# Patient Record
Sex: Female | Born: 1995 | Race: White | Hispanic: No | Marital: Single | Smoking: Never smoker
Health system: Southern US, Community
[De-identification: ages and names within clinical notes are randomized; demographics above are authoritative.]

## PROBLEM LIST (undated history)

## (undated) DIAGNOSIS — S069X9A Unspecified intracranial injury with loss of consciousness of unspecified duration, initial encounter: Secondary | ICD-10-CM

## (undated) DIAGNOSIS — F909 Attention-deficit hyperactivity disorder, unspecified type: Secondary | ICD-10-CM

## (undated) DIAGNOSIS — S069XAA Unspecified intracranial injury with loss of consciousness status unknown, initial encounter: Secondary | ICD-10-CM

## (undated) DIAGNOSIS — F319 Bipolar disorder, unspecified: Secondary | ICD-10-CM

## (undated) DIAGNOSIS — F419 Anxiety disorder, unspecified: Secondary | ICD-10-CM

## (undated) DIAGNOSIS — G35 Multiple sclerosis: Secondary | ICD-10-CM

## (undated) HISTORY — DX: Anxiety disorder, unspecified: F41.9

## (undated) HISTORY — PX: MYRINGOTOMY: SUR874

---
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
## 2016-09-13 ENCOUNTER — Encounter: Payer: Self-pay | Admitting: Internal Medicine

## 2017-07-17 ENCOUNTER — Encounter: Payer: Self-pay | Admitting: Obstetrics and Gynecology

## 2017-07-17 ENCOUNTER — Other Ambulatory Visit (HOSPITAL_COMMUNITY)
Admission: RE | Admit: 2017-07-17 | Discharge: 2017-07-17 | Disposition: A | Discharge status: Home / Self Care | Payer: Medicaid Other | Source: Ambulatory Visit | Attending: Obstetrics and Gynecology | Admitting: Obstetrics and Gynecology

## 2017-07-17 ENCOUNTER — Ambulatory Visit (INDEPENDENT_AMBULATORY_CARE_PROVIDER_SITE_OTHER): Payer: Medicaid Other | Admitting: Obstetrics and Gynecology

## 2017-07-17 VITALS — BP 108/70 | HR 67 | Ht 63.0 in | Wt 106.7 lb

## 2017-07-17 DIAGNOSIS — Z87898 Personal history of other specified conditions: Secondary | ICD-10-CM | POA: Insufficient documentation

## 2017-07-17 DIAGNOSIS — Z3049 Encounter for surveillance of other contraceptives: Secondary | ICD-10-CM | POA: Diagnosis not present

## 2017-07-17 DIAGNOSIS — Z8742 Personal history of other diseases of the female genital tract: Secondary | ICD-10-CM

## 2017-07-17 DIAGNOSIS — Z308 Encounter for other contraceptive management: Secondary | ICD-10-CM

## 2017-07-17 DIAGNOSIS — Z01419 Encounter for gynecological examination (general) (routine) without abnormal findings: Secondary | ICD-10-CM

## 2017-07-17 MED ORDER — NORETHIN ACE-ETH ESTRAD-FE 1-20 MG-MCG PO TABS: 1.0000 | ORAL_TABLET | Freq: Every day | ORAL | 11 refills | Status: DC

## 2017-07-17 NOTE — Patient Instructions (Signed)

## 2017-07-17 NOTE — Progress Notes (Signed)
Denise [name] is a 21 y.o. presents for yearly GYN exam and restart of OCP's. Last pap was in April 2017, LGSIL. Had previously took [place] until she ran. Sexual active without problems. She has no GYN complaints today.   Denies abnormal vaginal bleeding, discharge, pelvic pain, problems with intercourse or other gynecologic concerns.    Gynecologic History Patient's last menstrual period was 06/17/2017. Contraception: OCP (estrogen/progesterone) Last Pap: 2017. Results were: abnormal Last mammogram: NA. Results were: NA  Obstetric History OB History  No data available    Past Medical History:  Diagnosis Date  . ADHD (attention deficit hyperactivity disorder)     Past Surgical History:  Procedure Laterality Date  . MYRINGOTOMY    . WISDOM TOOTH EXTRACTION  2014    No current outpatient prescriptions on file prior to visit.   No current facility-administered medications on file prior to visit.     No Known Allergies  Social History   Social History  . Marital status: Single    Spouse name: N/A  . Number of children: N/A  . Years of education: N/A   Occupational History  . Not on file.   Social History Main Topics  . Smoking status: Never Smoker  . Smokeless tobacco: Never Used  . Alcohol use Yes     Comment: social  . Drug use: Yes    Types: Marijuana     Comment: once a week  . Sexual activity: Yes    Partners: Male    Birth control/ protection: None   Other Topics Concern  . Not on file   Social History Narrative  . No narrative on file    Family History  Problem Relation Age of Onset  . Hypertension Mother   . Hypertension Maternal Grandmother     The following portions of the patient's history were reviewed and updated as appropriate: allergies, current medications, past family history, past medical history, past social history, past surgical history and problem list.  Review of Systems Pertinent items are noted in HPI.   Objective:  BP  108/70   Pulse 67   Ht 5\' 3"  (1.6 m)   Wt 106 lb 11.2 oz (48.4 kg)   LMP 06/17/2017   BMI 18.90 kg/m  CONSTITUTIONAL: Well-developed, well-nourished female in no acute distress.  HENT:  Normocephalic, atraumatic, External right and left ear normal. Oropharynx is clear and moist EYES: Conjunctivae and EOM are normal. Pupils are equal, round, and reactive to light. No scleral icterus.  NECK: Normal range of motion, supple, no masses.  Normal thyroid.  SKIN: Skin is warm and dry. No rash noted. Not diaphoretic. No erythema. No pallor. NEUROLGIC: Alert and oriented to person, place, and time. Normal reflexes, muscle tone coordination. No cranial nerve deficit noted. PSYCHIATRIC: Normal mood and affect. Normal behavior. Normal judgment and thought content. CARDIOVASCULAR: Normal heart rate noted, regular rhythm RESPIRATORY: Clear to auscultation bilaterally. Effort and breath sounds normal, no problems with respiration noted. BREASTS: Symmetric in size. No masses, skin changes, nipple drainage, or lymphadenopathy. ABDOMEN: Soft, normal bowel sounds, no distention noted.  No tenderness, rebound or guarding.  PELVIC: Normal appearing external genitalia; normal appearing vaginal mucosa and cervix.  No abnormal discharge noted.  Pap smear obtained.  Normal uterine size, no other palpable masses, no uterine or adnexal tenderness. MUSCULOSKELETAL: Normal range of motion. No tenderness.  No cyanosis, clubbing, or edema.  2+ distal pulses.   Assessment:  Annual gynecologic examination with pap smear H/O abnormal pap smear  Contraceptive management Plan:  Will follow up results of pap smear and manage accordingly. Renew OCP's. U/R/B reviewed. To start after next cycle. Advised to use back up method.  Routine preventative health maintenance measures emphasized. Please refer to After Visit Summary for other counseling recommendations.    Hermina Staggers, MD, FACOG Attending Obstetrician &  Gynecologist Center for [facility], [facility]

## 2017-07-19 LAB — CYTOLOGY - PAP
Chlamydia: POSITIVE — AB
Diagnosis: NEGATIVE
HPV: NOT DETECTED
Neisseria Gonorrhea: NEGATIVE

## 2017-07-23 ENCOUNTER — Other Ambulatory Visit: Payer: Self-pay

## 2017-07-23 DIAGNOSIS — A749 Chlamydial infection, unspecified: Secondary | ICD-10-CM

## 2017-07-23 MED ORDER — AZITHROMYCIN 500 MG PO TABS: 1000.0000 mg | ORAL_TABLET | Freq: Once | ORAL | 1 refills | Status: AC

## 2017-08-27 ENCOUNTER — Emergency Department (HOSPITAL_COMMUNITY)
Admission: EM | Admit: 2017-08-27 | Discharge: 2017-08-28 | Disposition: A | Discharge status: Home / Self Care | Payer: Medicaid Other | Attending: Emergency Medicine | Admitting: Emergency Medicine

## 2017-08-27 ENCOUNTER — Encounter: Payer: Self-pay | Admitting: Obstetrics

## 2017-08-27 ENCOUNTER — Other Ambulatory Visit (HOSPITAL_COMMUNITY)
Admission: RE | Admit: 2017-08-27 | Discharge: 2017-08-27 | Disposition: A | Discharge status: Home / Self Care | Payer: Medicaid Other | Source: Ambulatory Visit | Attending: Obstetrics | Admitting: Obstetrics

## 2017-08-27 ENCOUNTER — Encounter (HOSPITAL_COMMUNITY): Payer: Self-pay | Admitting: Emergency Medicine

## 2017-08-27 ENCOUNTER — Other Ambulatory Visit (INDEPENDENT_AMBULATORY_CARE_PROVIDER_SITE_OTHER): Payer: Medicaid Other | Admitting: Obstetrics

## 2017-08-27 VITALS — BP 130/84 | HR 84 | Wt 97.2 lb

## 2017-08-27 DIAGNOSIS — Z202 Contact with and (suspected) exposure to infections with a predominantly sexual mode of transmission: Secondary | ICD-10-CM | POA: Diagnosis not present

## 2017-08-27 DIAGNOSIS — F32A Depression, unspecified: Secondary | ICD-10-CM

## 2017-08-27 DIAGNOSIS — F329 Major depressive disorder, single episode, unspecified: Secondary | ICD-10-CM | POA: Insufficient documentation

## 2017-08-27 DIAGNOSIS — Z008 Encounter for other general examination: Secondary | ICD-10-CM | POA: Insufficient documentation

## 2017-08-27 DIAGNOSIS — Z79899 Other long term (current) drug therapy: Secondary | ICD-10-CM | POA: Insufficient documentation

## 2017-08-27 NOTE — Progress Notes (Signed)
Patient complains of being stressed out, she has lost 9 lbs since July 25th. She is here for TOC + chlamydia.

## 2017-08-27 NOTE — ED Notes (Signed)
Bed: WLPT4 Expected date:  Expected time:  Means of arrival:  Comments: 

## 2017-08-28 ENCOUNTER — Encounter: Payer: Self-pay | Admitting: Obstetrics

## 2017-08-28 LAB — COMPREHENSIVE METABOLIC PANEL
ALT: 12 U/L — ABNORMAL LOW (ref 14–54)
AST: 18 U/L (ref 15–41)
Albumin: 4.4 g/dL (ref 3.5–5.0)
Alkaline Phosphatase: 34 U/L — ABNORMAL LOW (ref 38–126)
Anion gap: 8 (ref 5–15)
BUN: 16 mg/dL (ref 6–20)
CO2: 24 mmol/L (ref 22–32)
Calcium: 9.2 mg/dL (ref 8.9–10.3)
Chloride: 105 mmol/L (ref 101–111)
Creatinine, Ser: 0.61 mg/dL (ref 0.44–1.00)
GFR calc Af Amer: >60 mL/min (ref >60)
GFR calc non Af Amer: >60 mL/min (ref >60)
Glucose, Bld: 90 mg/dL (ref 65–99)
Potassium: 3.4 mmol/L — ABNORMAL LOW (ref 3.5–5.1)
Sodium: 137 mmol/L (ref 135–145)
Total Bilirubin: 0.4 mg/dL (ref 0.3–1.2)
Total Protein: 7.6 g/dL (ref 6.5–8.1)

## 2017-08-28 LAB — PREGNANCY, URINE: Preg Test, Ur: NEGATIVE

## 2017-08-28 LAB — CBC
HCT: 36.9 % (ref 36.0–46.0)
Hemoglobin: 13 g/dL (ref 12.0–15.0)
MCH: 31.3 pg (ref 26.0–34.0)
MCHC: 35.2 g/dL (ref 30.0–36.0)
MCV: 88.9 fL (ref 78.0–100.0)
Platelets: 256 10*3/uL (ref 150–400)
RBC: 4.15 MIL/uL (ref 3.87–5.11)
RDW: 12.4 % (ref 11.5–15.5)
WBC: 10.8 10*3/uL — ABNORMAL HIGH (ref 4.0–10.5)

## 2017-08-28 LAB — RAPID URINE DRUG SCREEN, HOSP PERFORMED
Amphetamines: NOT DETECTED
Barbiturates: NOT DETECTED
Benzodiazepines: NOT DETECTED
Cocaine: NOT DETECTED
Opiates: NOT DETECTED
Tetrahydrocannabinol: POSITIVE — AB

## 2017-08-28 LAB — ETHANOL: Alcohol, Ethyl (B): <5 mg/dL (ref <5)

## 2017-08-28 LAB — ACETAMINOPHEN LEVEL: Acetaminophen (Tylenol), Serum: <10 ug/mL — ABNORMAL LOW (ref 10–30)

## 2017-08-28 LAB — SALICYLATE LEVEL: Salicylate Lvl: <7 mg/dL (ref 2.8–30.0)

## 2017-08-28 NOTE — ED Notes (Signed)
Pt ambulatory and independent at discharge.  Verbalized understanding of discharge instructions 

## 2017-08-28 NOTE — Discharge Instructions (Signed)
Substance Abuse Treatment Programs ° °Intensive Outpatient Programs °[facility]     °[address]      °[place], [place]                   °[phone]      ° °[facility] °[address] °[place], [place] °[phone] ° °[facility]     °(Inpatient and outpatient)     °[address]           °[phone]   ° °[facility] °[phone] (Suboxone and Methadone) ° °[address]      °[place], [place] [address]      °[phone]      ° °[address] °[place], [place] °[phone] ° °[facility] (Outpatient/Inpatient, [facility])    °(insurance only) [phone]      °       °[facility] (Groups & Residential) °[place], [place] °[phone] ° °   °[facility]     °[address]     °[place], [place]      °[phone]      ° °[facility] (for caregivers and family) °[address] °[place], [place] °336-299-4655 ° ° ° ° ° °Residential Treatment Programs °[facility]      °[address], [place], [place] [address]  °[phone]      ° °[facility] °[address] [place], [place] [address] °[phone] ° °[facility]        °[phone]      ° °[facility] °[phone] ° °[facility]             °[address]                                         °[place], [place]                                                °[phone] or [phone]                              ° °[facility] [place] °[address] °[place], [address] °[phone] ° °[facility]    °[address]      °[place], [place]     °[phone]      ° °[facility] °[address] °[place], [place] °[phone] ° °[facility]   °[address]     °[place], [place] [address]     °[phone]      °Admissions: 8am-3pm M-F ° °[facility] °[address] °[place],  [place] °[phone] ° °[facility]: Residential Program (90 Days)   °[place],       °336-725-8389 or [phone]    ° °[facility] °[place],  °(Walk in Hours over the weekend or by referral) ° °[facility] °[address], [place],  [address] °([phone] ° °Crisis Mobile: [facility]:  1-877-626-1772 (for crisis response 24 hours a day) °[facility] Hotline:      1-800-256-2452 °Outpatient Psychiatry and Counseling ° °[facility]: Mobile Crisis   Management 24 hours:  1-877-626-1772 ° °[facility] sliding scale fee and walk in schedule: M-F 8am-12pm/1pm-3pm °[address]  °[place], [place] [address] °[phone] ° °[facility] °[address] °[place], [place] [address] °[phone] ° °[facility] (Formerly known [facility])- new patient walk-in appointments available Monday - Friday 8am -3pm.          °[address] °[place], [place] [address] °336-676-6840 or crisis line- 336-676-6905 ° °[facility]/ Intensive Outpatient Therapy Program °[address] °[place], [place] [address] °336-832-9804 ° °[facility]                  °Crisis Services      °[phone]      °[address]     °[place], [place] [address]                ° °[facility]   °[facility] °[phone] °[address] °[place], [place] [address] ° ° °[facility]          °[address],  °[place], [name] [address]       °[phone] ° °[facility] °[address] °[place], [place] [address] °336-292-1510 ° °[facility]    °[address]    °[place], [place] [address]     °[phone]      ° °Parish McKinney, MD     °[address]     °[place] [address]     °336-282-1251     °  °[facility] °[address] °[place] [address] ° °[facility]     °[address]     °[place], [place]      °336-542-2076      ° °[facility] °Shamsher Ahluwalia, MD °[address] °[place], [place] [address] °[phone] ° °[facility]     °[address]     °[place], [place] [address]     °336-274-1237      ° °[facility] °[address] °[place], [place] [address] °336-854-4450 °Resources for Temporary Residential Assistance/Crisis Centers ° °DAY CENTERS °[facility] °M-F 8am-3pm   °[address] [place], [place] [address]   336-332-0824 °Services include: laundry, barbering, support groups, case management, phone  & computer access, showers, AA/NA mtgs, mental health/substance abuse nurse, job skills class, disability information, VA assistance, spiritual classes, etc.  ° °HOMELESS SHELTERS ° °[facility]     °[facility]   °[address], [place]     °[phone]       °       °[facility] (women and children)       °[address] °[place], [place] [address] °336-275-0820 °[email or web address] for application and process °Application Required ° °[facility]   °[address]    °[place] [address]     °[phone]       °             °[facility] °[address] °, [place] [address] °336.273.5572 °336-235-0363(schedule application appt.) °Application Required ° °[facility] (women only)    °[address]     °[place], [place] [address]     °336-884-1039      °  Intake starts 6pm daily °Need valid ID, SSC, & Police report °[facility] [place] °[address] °[place], [place] °336-881-5420 °Application Required ° °[facility] (men only)     °[address]      °[place], [place]     °[phone]      ° °[facility] °(Pregnant women only) °[address] °[place], [place] °336-275-0206 ° °[facility]      °[address]      °[place], [place] [address]     °[phone]      °       °[facility] °[address] °[place], [place] °336-723-1848 °90 day commitment/SA/Application process ° °[facility](men only)     °[address]     °[place], [place]     °[phone]       °Check-in at 7pm     °       °[facility] °[address] °[place], [place] [address] °336-248-6684 °Men/Women/Women and Children must be there by 7 pm ° °[facility] °[place], [place] °336-722-8721                ° °

## 2017-08-28 NOTE — ED Provider Notes (Signed)
[facility]-EMERGENCY DEPT Provider Note   CSN: [id number] Arrival date & time: 08/27/17  2144     History   Chief Complaint Chief Complaint  Patient presents with  . Medical Clearance  . Depression    HPI Denise [name] is a 21 y.o. female.  The history is provided by the patient and a parent.  Depression  This is a chronic problem. The current episode started more than 1 week ago. The problem occurs daily. The problem has been gradually worsening. Pertinent negatives include no chest pain, no abdominal pain, no headaches and no shortness of breath. Nothing relieves the symptoms. She has tried nothing for the symptoms.  pt with long h/o depression due to family issues as well as job stress She has not seen any therapist recently for this  She is not taking meds No SI/HI reported She reports tonight her symptoms became severe and that is why she came to ED   Past Medical History:  Diagnosis Date  . ADHD (attention deficit hyperactivity disorder)     Patient Active Problem List   Diagnosis Date Noted  . Encounter for other contraceptive management 07/17/2017    Past Surgical History:  Procedure Laterality Date  . MYRINGOTOMY    . WISDOM TOOTH EXTRACTION  2014    OB History    No data available       Home Medications    Prior to Admission medications   Medication Sig Start Date End Date Taking? Authorizing Provider  norethindrone-ethinyl estradiol (JUNEL FE 1/20) 1-20 MG-MCG tablet Take 1 tablet by mouth daily. 07/17/17  Yes Hermina Staggers, MD    Family History Family History  Problem Relation Age of Onset  . Hypertension Mother   . Hypertension Maternal Grandmother     Social History Social History  Substance Use Topics  . Smoking status: Never Smoker  . Smokeless tobacco: Never Used  . Alcohol use Yes     Comment: social     Allergies   Patient has no known allergies.   Review of Systems Review of Systems  Constitutional: Negative for fever.    Respiratory: Negative for shortness of breath.   Cardiovascular: Negative for chest pain.  Gastrointestinal: Negative for abdominal pain.  Neurological: Negative for headaches.  Psychiatric/Behavioral: Positive for depression. Negative for suicidal ideas. The patient is nervous/anxious.   All other systems reviewed and are negative.    Physical Exam Updated Vital Signs BP 120/82 (BP Location: Left Arm)   Pulse 71   Temp 98.2 F (36.8 C) (Oral)   Resp 16   Ht 1.575 m (5\' 2" )   Wt 43.5 kg (96 lb)   LMP 08/18/2017 (Approximate)   SpO2 100%   BMI 17.56 kg/m   Physical Exam CONSTITUTIONAL: thin appearing, tearful HEAD: Normocephalic/atraumatic EYES: EOMI ENMT: Mucous membranes moist NECK: supple no meningeal signs CV: S1/S2 noted LUNGS: Lungs are clear to auscultation bilaterally, no apparent distress ABDOMEN: soft GU:no cva tenderness NEURO: Pt is awake/alert/appropriate, moves all extremitiesx4.   EXTREMITIES:   full ROM SKIN: warm, color normal PSYCH: tearful  ED Treatments / Results  Labs (all labs ordered are listed, but only abnormal results are displayed) Labs Reviewed  COMPREHENSIVE METABOLIC PANEL - Abnormal; Notable for the following:       Result Value   Potassium 3.4 (*)    ALT 12 (*)    Alkaline Phosphatase 34 (*)    All other components within normal limits  ACETAMINOPHEN LEVEL - Abnormal; Notable for  the following:    Acetaminophen (Tylenol), Serum <10 (*)    All other components within normal limits  CBC - Abnormal; Notable for the following:    WBC 10.8 (*)    All other components within normal limits  RAPID URINE DRUG SCREEN, HOSP PERFORMED - Abnormal; Notable for the following:    Tetrahydrocannabinol POSITIVE (*)    All other components within normal limits  ETHANOL  SALICYLATE LEVEL  PREGNANCY, URINE    EKG  EKG Interpretation None       Radiology No results found.  Procedures Procedures (including critical care  time)  Medications Ordered in ED Medications - No data to display   Initial Impression / Assessment and Plan / ED Course  I have reviewed the triage vital signs and the nursing notes.  Pertinent labs  results that were available during my care of the patient were reviewed by me and considered in my medical decision making (see chart for details).     After long discussion, pt would like to be discharged with resources This has been an ongoing issue for years She is not acutely suicidal She has mother present who will provide support Outpatient resources given We discussed strict ER return precautions   Final Clinical Impressions(s) / ED Diagnoses   Final diagnoses:  Depression, unspecified depression type    New Prescriptions Discharge Medication List as of 08/28/2017 12:54 AM       Zadie Rhine, MD 08/28/17 0221

## 2017-08-28 NOTE — Progress Notes (Signed)
Patient ID: Denise [name], female   DOB: 1996/08/29, 21 y.o.   MRN: [id number]  No chief complaint on file.   HPI Denise [name] is a 21 y.o. female.  History of positive chlamydia, treated.  Presents today for TOC cultures. HPI  Past Medical History:  Diagnosis Date  . ADHD (attention deficit hyperactivity disorder)     Past Surgical History:  Procedure Laterality Date  . MYRINGOTOMY    . WISDOM TOOTH EXTRACTION  2014    Family History  Problem Relation Age of Onset  . Hypertension Mother   . Hypertension Maternal Grandmother     Social History Social History  Substance Use Topics  . Smoking status: Never Smoker  . Smokeless tobacco: Never Used  . Alcohol use Yes     Comment: social    No Known Allergies  Current Outpatient Prescriptions  Medication Sig Dispense Refill  . norethindrone-ethinyl estradiol (JUNEL FE 1/20) 1-20 MG-MCG tablet Take 1 tablet by mouth daily. 1 Package 11   No current facility-administered medications for this visit.     Review of Systems Review of Systems Constitutional: negative for fatigue and weight loss Respiratory: negative for cough and wheezing Cardiovascular: negative for chest pain, fatigue and palpitations Gastrointestinal: negative for abdominal pain and change in bowel habits Genitourinary:negative Integument/breast: negative for nipple discharge Musculoskeletal:negative for myalgias Neurological: negative for gait problems and tremors Behavioral/Psych: negative for abusive relationship, depression Endocrine: negative for temperature intolerance      Blood pressure 130/84, pulse 84, weight 97 lb 3.2 oz (44.1 kg), last menstrual period 08/18/2017.  Physical Exam Physical Exam           General:  Alert and no distress Abdomen:  normal findings: no organomegaly, soft, non-tender and no hernia  Pelvis:  External genitalia: normal general appearance Urinary system: urethral meatus normal and bladder without fullness,  nontender Vaginal: normal without tenderness, induration or masses Cervix: normal appearance Adnexa: normal bimanual exam Uterus: anteverted and non-tender, normal size    50% of 15 min visit spent on counseling and coordination of care.    Data Reviewed Cultures  Assessment     1. Chlamydia contact, treated Rx: - Cervicovaginal ancillary only    Plan    Follow up in 1 year for Annual.  No orders of the defined types were placed in this encounter.  No orders of the defined types were placed in this encounter.

## 2017-08-28 NOTE — [facility] Counselor (Signed)
Clinician spoke to [place], [place] and noted the pt will be discharged with OPT resources.  Redmond Pulling, MS, [facility], [facility] Triage Specialist [phone]

## 2017-08-29 LAB — CERVICOVAGINAL ANCILLARY ONLY
Chlamydia: NEGATIVE
Neisseria Gonorrhea: NEGATIVE

## 2018-07-13 ENCOUNTER — Other Ambulatory Visit: Payer: Self-pay | Admitting: Obstetrics and Gynecology

## 2018-07-13 DIAGNOSIS — Z308 Encounter for other contraceptive management: Secondary | ICD-10-CM

## 2018-12-04 ENCOUNTER — Other Ambulatory Visit: Payer: Self-pay | Admitting: *Deleted

## 2018-12-04 DIAGNOSIS — Z308 Encounter for other contraceptive management: Secondary | ICD-10-CM

## 2018-12-04 MED ORDER — NORETHIN ACE-ETH ESTRAD-FE 1-20 MG-MCG PO TABS: 1.0000 | ORAL_TABLET | Freq: Every day | ORAL | 0 refills | Status: DC

## 2018-12-04 NOTE — Progress Notes (Signed)
Refill request from pharmacy for [facility] pill. One refill sent to pharmacy and pt has been scheduled for AEX. Pt made aware to keep appt in order to continue refills.

## 2018-12-26 ENCOUNTER — Other Ambulatory Visit: Payer: Self-pay | Admitting: Obstetrics

## 2018-12-26 DIAGNOSIS — Z308 Encounter for other contraceptive management: Secondary | ICD-10-CM

## 2018-12-29 ENCOUNTER — Encounter: Payer: Self-pay | Admitting: Obstetrics and Gynecology

## 2018-12-29 ENCOUNTER — Ambulatory Visit (INDEPENDENT_AMBULATORY_CARE_PROVIDER_SITE_OTHER): Payer: BLUE CROSS/BLUE SHIELD | Admitting: Obstetrics and Gynecology

## 2018-12-29 ENCOUNTER — Other Ambulatory Visit (HOSPITAL_COMMUNITY)
Admission: RE | Admit: 2018-12-29 | Discharge: 2018-12-29 | Disposition: A | Discharge status: Home / Self Care | Payer: BLUE CROSS/BLUE SHIELD | Source: Ambulatory Visit | Attending: Obstetrics and Gynecology | Admitting: Obstetrics and Gynecology

## 2018-12-29 VITALS — BP 113/75 | HR 102 | Ht 64.0 in | Wt 109.8 lb

## 2018-12-29 DIAGNOSIS — Z01419 Encounter for gynecological examination (general) (routine) without abnormal findings: Secondary | ICD-10-CM

## 2018-12-29 DIAGNOSIS — Z3202 Encounter for pregnancy test, result negative: Secondary | ICD-10-CM

## 2018-12-29 DIAGNOSIS — Z308 Encounter for other contraceptive management: Secondary | ICD-10-CM

## 2018-12-29 LAB — POCT URINE PREGNANCY: Preg Test, Ur: NEGATIVE

## 2018-12-29 MED ORDER — NORETHIN ACE-ETH ESTRAD-FE 1-20 MG-MCG PO TABS: 1.0000 | ORAL_TABLET | Freq: Every day | ORAL | 4 refills | Status: DC

## 2018-12-29 NOTE — Patient Instructions (Signed)
Contraception Choices  Contraception, also called birth control, refers to methods or devices that prevent pregnancy.  Hormonal methods  Contraceptive implant    A contraceptive implant is a thin, plastic tube that contains a hormone. It is inserted into the upper part of the arm. It can remain in place for up to 3 years.  Progestin-only injections  Progestin-only injections are injections of progestin, a synthetic form of the hormone progesterone. They are given every 3 months by a health care provider.  Birth control pills    Birth control pills are pills that contain hormones that prevent pregnancy. They must be taken once a day, preferably at the same time each day.  Birth control patch    The birth control patch contains hormones that prevent pregnancy. It is placed on the skin and must be changed once a week for three weeks and removed on the fourth week. A prescription is needed to use this method of contraception.  Vaginal ring    A vaginal ring contains hormones that prevent pregnancy. It is placed in the vagina for three weeks and removed on the fourth week. After that, the process is repeated with a new ring. A prescription is needed to use this method of contraception.  Emergency contraceptive  Emergency contraceptives prevent pregnancy after unprotected sex. They come in pill form and can be taken up to 5 days after sex. They work best the sooner they are taken after having sex. Most emergency contraceptives are available without a prescription. This method should not be used as your only form of birth control.  Barrier methods  Female condom    A female condom is a thin sheath that is worn over the penis during sex. Condoms keep sperm from going inside a woman's body. They can be used with a spermicide to increase their effectiveness. They should be disposed after a single use.  Female condom    A female condom is a soft, loose-fitting sheath that is put into the vagina before sex. The condom keeps sperm  from going inside a woman's body. They should be disposed after a single use.  Diaphragm    A diaphragm is a soft, dome-shaped barrier. It is inserted into the vagina before sex, along with a spermicide. The diaphragm blocks sperm from entering the uterus, and the spermicide kills sperm. A diaphragm should be left in the vagina for 6-8 hours after sex and removed within 24 hours.  A diaphragm is prescribed and fitted by a health care provider. A diaphragm should be replaced every 1-2 years, after giving birth, after gaining more than 15 lb (6.8 kg), and after pelvic surgery.  Cervical cap    A cervical cap is a round, soft latex or plastic cup that fits over the cervix. It is inserted into the vagina before sex, along with spermicide. It blocks sperm from entering the uterus. The cap should be left in place for 6-8 hours after sex and removed within 48 hours. A cervical cap must be prescribed and fitted by a health care provider. It should be replaced every 2 years.  Sponge    A sponge is a soft, circular piece of polyurethane foam with spermicide on it. The sponge helps block sperm from entering the uterus, and the spermicide kills sperm. To use it, you make it wet and then insert it into the vagina. It should be inserted before sex, left in for at least 6 hours after sex, and removed and thrown away within   30 hours.  Spermicides  Spermicides are chemicals that kill or block sperm from entering the cervix and uterus. They can come as a cream, jelly, suppository, foam, or tablet. A spermicide should be inserted into the vagina with an applicator at least 10-15 minutes before sex to allow time for it to work. The process must be repeated every time you have sex. Spermicides do not require a prescription.  Intrauterine contraception  Intrauterine device (IUD)  An IUD is a T-shaped device that is put in a woman's uterus. There are two types:   Hormone IUD.This type contains progestin, a synthetic form of the hormone  progesterone. This type can stay in place for 3-5 years.   Copper IUD.This type is wrapped in copper wire. It can stay in place for 10 years.    Permanent methods of contraception  Female tubal ligation  In this method, a woman's fallopian tubes are sealed, tied, or blocked during surgery to prevent eggs from traveling to the uterus.  Hysteroscopic sterilization  In this method, a small, flexible insert is placed into each fallopian tube. The inserts cause scar tissue to form in the fallopian tubes and block them, so sperm cannot reach an egg. The procedure takes about 3 months to be effective. Another form of birth control must be used during those 3 months.  Female sterilization  This is a procedure to tie off the tubes that carry sperm (vasectomy). After the procedure, the man can still ejaculate fluid (semen).  Natural planning methods  Natural family planning  In this method, a couple does not have sex on days when the woman could become pregnant.  Calendar method  This means keeping track of the length of each menstrual cycle, identifying the days when pregnancy can happen, and not having sex on those days.  Ovulation method  In this method, a couple avoids sex during ovulation.  Symptothermal method  This method involves not having sex during ovulation. The woman typically checks for ovulation by watching changes in her temperature and in the consistency of cervical mucus.  Post-ovulation method  In this method, a couple waits to have sex until after ovulation.  Summary   Contraception, also called birth control, means methods or devices that prevent pregnancy.   Hormonal methods of contraception include implants, injections, pills, patches, vaginal rings, and emergency contraceptives.   Barrier methods of contraception can include female condoms, female condoms, diaphragms, cervical caps, sponges, and spermicides.   There are two types of IUDs (intrauterine devices). An IUD can be put in a woman's uterus to  prevent pregnancy for 3-5 years.   Permanent sterilization can be done through a procedure for males, females, or both.   Natural family planning methods involve not having sex on days when the woman could become pregnant.  This information is not intended to replace advice given to you by your health care provider. Make sure you discuss any questions you have with your health care provider.  Document Released: 12/10/2005 Document Revised: 12/12/2017 Document Reviewed: 01/12/2017  Elsevier Interactive Patient Education  2019 [employer]vier Inc[employer]

## 2018-12-29 NOTE — Addendum Note (Signed)
Addended by: Dalphine Handing on: 12/29/2018 11:43 AM   Modules accepted: Orders

## 2018-12-29 NOTE — Progress Notes (Signed)
Subjective:     Denise [name] is a 23 y.o. female G0 with BMI 18 and LMP 12/23/18 who is here for a comprehensive physical exam. The patient reports no problems. She is sexually active using COC for contraception. She denies any pelvic pain or abnormal discharge. She reports an odor with urination but denies any dysuria, frequency or urgency  Past Medical History:  Diagnosis Date  . ADHD (attention deficit hyperactivity disorder)    Past Surgical History:  Procedure Laterality Date  . MYRINGOTOMY    . WISDOM TOOTH EXTRACTION  2014   Family History  Problem Relation Age of Onset  . Hypertension Mother   . Hypertension Maternal Grandmother     Social History   Socioeconomic History  . Marital status: Single    Spouse name: Not on file  . Number of children: Not on file  . Years of education: Not on file  . Highest education level: Not on file  Occupational History  . Not on file  Social Needs  . Financial resource strain: Not on file  . Food insecurity:    Worry: Not on file    Inability: Not on file  . Transportation needs:    Medical: Not on file    Non-medical: Not on file  Tobacco Use  . Smoking status: Never Smoker  . Smokeless tobacco: Never Used  Substance and Sexual Activity  . Alcohol use: Yes    Comment: social  . Drug use: Yes    Types: Marijuana  . Sexual activity: Yes    Partners: Male    Birth control/protection: OCP  Lifestyle  . Physical activity:    Days per week: Not on file    Minutes per session: Not on file  . Stress: Not on file  Relationships  . Social connections:    Talks on phone: Not on file    Gets together: Not on file    Attends religious service: Not on file    Active member of club or organization: Not on file    Attends meetings of clubs or organizations: Not on file    Relationship status: Not on file  . Intimate partner violence:    Fear of current or ex partner: Not on file    Emotionally abused: Not on file   Physically abused: Not on file    Forced sexual activity: Not on file  Other Topics Concern  . Not on file  Social History Narrative  . Not on file   Health Maintenance  Topic Date Due  . HIV Screening  01/29/2012  . TETANUS/TDAP  01/29/2016  . INFLUENZA VACCINE  07/24/2018  . CHLAMYDIA SCREENING  08/27/2018  . PAP-Cervical Cytology Screening  07/17/2020  . PAP SMEAR-Modifier  07/17/2020    Review of Systems Pertinent items are noted in HPI.   Objective:  Blood pressure 113/75, pulse (!) 102, height 5\' 4"  (1.626 m), weight 109 lb 12.8 oz (49.8 kg), last menstrual period 12/23/2018.     GENERAL: Well-developed, well-nourished female in no acute distress.  HEENT: Normocephalic, atraumatic. Sclerae anicteric.  NECK: Supple. Normal thyroid.  LUNGS: Clear to auscultation bilaterally.  HEART: Regular rate and rhythm. BREASTS: Symmetric in size. No palpable masses or lymphadenopathy, skin changes, or nipple drainage. ABDOMEN: Soft, nontender, nondistended. No organomegaly. PELVIC: Normal external female genitalia. Vagina is pink and rugated.  Normal discharge. Normal appearing cervix. Uterus is normal in size. No adnexal mass or tenderness. EXTREMITIES: No cyanosis, clubbing, or edema, 2+ distal  pulses.    Assessment:    Healthy female exam.      Plan:    Pap smear with cultures collected Patient declined other STI screen Urine culture collected Refill on COC provided Patient will be contacted with abnormal results See After Visit Summary for Counseling Recommendations

## 2018-12-29 NOTE — Progress Notes (Signed)
Pt presents for annual, pap, and all vaginal STD testing only.  Pt c/o malodorous urine x couple weeks.  Requests 90 supply Junel

## 2018-12-30 LAB — CYTOLOGY - PAP
Chlamydia: NEGATIVE
Diagnosis: NEGATIVE
Neisseria Gonorrhea: NEGATIVE

## 2018-12-31 LAB — URINE CULTURE

## 2018-12-31 MED ORDER — CEPHALEXIN 500 MG PO CAPS: 500.0000 mg | ORAL_CAPSULE | Freq: Three times a day (TID) | ORAL | 0 refills | Status: DC

## 2018-12-31 NOTE — Addendum Note (Signed)
Addended by: Catalina Antigua on: 12/31/2018 03:08 PM   Modules accepted: Orders

## 2020-02-17 ENCOUNTER — Other Ambulatory Visit: Payer: Self-pay | Admitting: Obstetrics and Gynecology

## 2020-02-29 ENCOUNTER — Telehealth: Payer: Self-pay

## 2020-02-29 DIAGNOSIS — Z308 Encounter for other contraceptive management: Secondary | ICD-10-CM

## 2020-02-29 MED ORDER — NORETHIN ACE-ETH ESTRAD-FE 1-20 MG-MCG PO TABS: 1.0000 | ORAL_TABLET | Freq: Every day | ORAL | 0 refills | Status: DC

## 2020-02-29 NOTE — Telephone Encounter (Signed)
Returned call to advise that 1 BC refill was sent per protocol, vm is full, annual scheduled for 03-30-20

## 2020-03-22 ENCOUNTER — Other Ambulatory Visit: Payer: Self-pay | Admitting: Obstetrics and Gynecology

## 2020-03-22 DIAGNOSIS — Z308 Encounter for other contraceptive management: Secondary | ICD-10-CM

## 2020-03-30 ENCOUNTER — Ambulatory Visit (INDEPENDENT_AMBULATORY_CARE_PROVIDER_SITE_OTHER): Payer: Medicaid Other | Admitting: Obstetrics and Gynecology

## 2020-03-30 ENCOUNTER — Other Ambulatory Visit (HOSPITAL_COMMUNITY)
Admission: RE | Admit: 2020-03-30 | Discharge: 2020-03-30 | Disposition: A | Discharge status: Home / Self Care | Payer: Medicaid Other | Source: Ambulatory Visit | Attending: Obstetrics and Gynecology | Admitting: Obstetrics and Gynecology

## 2020-03-30 ENCOUNTER — Other Ambulatory Visit: Payer: Self-pay

## 2020-03-30 ENCOUNTER — Encounter: Payer: Self-pay | Admitting: Obstetrics and Gynecology

## 2020-03-30 VITALS — BP 104/71 | HR 93 | Wt 102.0 lb

## 2020-03-30 DIAGNOSIS — Z308 Encounter for other contraceptive management: Secondary | ICD-10-CM

## 2020-03-30 DIAGNOSIS — B9689 Other specified bacterial agents as the cause of diseases classified elsewhere: Secondary | ICD-10-CM | POA: Diagnosis not present

## 2020-03-30 DIAGNOSIS — Z01419 Encounter for gynecological examination (general) (routine) without abnormal findings: Secondary | ICD-10-CM

## 2020-03-30 DIAGNOSIS — N760 Acute vaginitis: Secondary | ICD-10-CM | POA: Diagnosis not present

## 2020-03-30 DIAGNOSIS — Z01411 Encounter for gynecological examination (general) (routine) with abnormal findings: Secondary | ICD-10-CM | POA: Diagnosis not present

## 2020-03-30 MED ORDER — NORETHIN ACE-ETH ESTRAD-FE 1-20 MG-MCG PO TABS: 1.0000 | ORAL_TABLET | Freq: Every day | ORAL | 4 refills | Status: DC

## 2020-03-30 NOTE — Progress Notes (Signed)
Subjective:     Denise [name] is a 24 y.o. female P0 with BMI 17 who is here for a comprehensive physical exam. The patient reports the presence of a non pruritic vaginal discharge with odor. She is sexually active using COC for contraception. Patient denies any pelvic pain or abnormal bleeding. Patient is without any other complaints.  Past Medical History:  Diagnosis Date  . ADHD (attention deficit hyperactivity disorder)    Past Surgical History:  Procedure Laterality Date  . MYRINGOTOMY    . WISDOM TOOTH EXTRACTION  2014   Family History  Problem Relation Age of Onset  . Hypertension Mother   . Hypertension Maternal Grandmother     Social History   Socioeconomic History  . Marital status: Single    Spouse name: Not on file  . Number of children: Not on file  . Years of education: Not on file  . Highest education level: Not on file  Occupational History  . Not on file  Tobacco Use  . Smoking status: Never Smoker  . Smokeless tobacco: Never Used  Substance and Sexual Activity  . Alcohol use: Yes    Comment: social  . Drug use: Yes    Types: Marijuana  . Sexual activity: Yes    Partners: Male    Birth control/protection: OCP  Other Topics Concern  . Not on file  Social History Narrative  . Not on file   Social Determinants of Health   Financial Resource Strain:   . Difficulty of Paying Living Expenses:   Food Insecurity:   . Worried About Programme researcher, broadcasting/film/video in the Last Year:   . Barista in the Last Year:   Transportation Needs:   . Freight forwarder (Medical):   Marland Kitchen Lack of Transportation (Non-Medical):   Physical Activity:   . Days of Exercise per Week:   . Minutes of Exercise per Session:   Stress:   . Feeling of Stress :   Social Connections:   . Frequency of Communication with Friends and Family:   . Frequency of Social Gatherings with Friends and Family:   . Attends Religious Services:   . Active Member of Clubs or Organizations:   .  Attends Banker Meetings:   Marland Kitchen Marital Status:   Intimate Partner Violence:   . Fear of Current or Ex-Partner:   . Emotionally Abused:   Marland Kitchen Physically Abused:   . Sexually Abused:    Health Maintenance  Topic Date Due  . HIV Screening  Never done  . TETANUS/TDAP  Never done  . CHLAMYDIA SCREENING  08/27/2018  . INFLUENZA VACCINE  07/24/2020  . PAP-Cervical Cytology Screening  12/29/2021  . PAP SMEAR-Modifier  12/29/2021       Review of Systems Pertinent items noted in HPI and remainder of comprehensive ROS otherwise negative.   Objective:      GENERAL: Well-developed, well-nourished female in no acute distress.  HEENT: Normocephalic, atraumatic. Sclerae anicteric.  NECK: Supple. Normal thyroid.  LUNGS: Clear to auscultation bilaterally.  HEART: Regular rate and rhythm. BREASTS: Symmetric in size. No palpable masses or lymphadenopathy, skin changes, or nipple drainage. ABDOMEN: Soft, nontender, nondistended. No organomegaly. PELVIC: Normal external female genitalia. Vagina is pink and rugated.  Normal discharge. Normal appearing cervix. Uterus is normal in size. No adnexal mass or tenderness. EXTREMITIES: No cyanosis, clubbing, or edema, 2+ distal pulses.    Assessment:    Healthy female exam.      Plan:  Vaginal swab collected Patient with normal pap smear 2020 Patient declined HIV, Hep and syphilis testing Patient will be contacted with abnormal discharge See After Visit Summary for Counseling Recommendations

## 2020-03-31 LAB — CERVICOVAGINAL ANCILLARY ONLY
Bacterial Vaginitis (gardnerella): POSITIVE — AB
Candida Glabrata: NEGATIVE
Candida Vaginitis: NEGATIVE
Chlamydia: NEGATIVE
Comment: NEGATIVE
Comment: NEGATIVE
Comment: NEGATIVE
Comment: NEGATIVE
Comment: NEGATIVE
Comment: NORMAL
Neisseria Gonorrhea: NEGATIVE
Trichomonas: NEGATIVE

## 2020-03-31 MED ORDER — METRONIDAZOLE 500 MG PO TABS: 500.0000 mg | ORAL_TABLET | Freq: Two times a day (BID) | ORAL | 0 refills | Status: DC

## 2020-03-31 NOTE — Addendum Note (Signed)
Addended by: Catalina Antigua on: 03/31/2020 12:46 PM   Modules accepted: Orders

## 2020-04-06 ENCOUNTER — Telehealth: Payer: Self-pay

## 2020-04-06 NOTE — Telephone Encounter (Signed)
S/w patient and she stated that she picked up rx and is aware of results.

## 2020-09-07 ENCOUNTER — Emergency Department (HOSPITAL_COMMUNITY)
Admission: EM | Admit: 2020-09-07 | Discharge: 2020-09-07 | Disposition: A | Discharge status: Home / Self Care | Payer: Self-pay | Attending: Emergency Medicine | Admitting: Emergency Medicine

## 2020-09-07 ENCOUNTER — Other Ambulatory Visit: Payer: Self-pay

## 2020-09-07 ENCOUNTER — Encounter (HOSPITAL_COMMUNITY): Payer: Self-pay

## 2020-09-07 ENCOUNTER — Emergency Department (HOSPITAL_COMMUNITY): Payer: Self-pay

## 2020-09-07 DIAGNOSIS — F909 Attention-deficit hyperactivity disorder, unspecified type: Secondary | ICD-10-CM | POA: Diagnosis not present

## 2020-09-07 DIAGNOSIS — M546 Pain in thoracic spine: Secondary | ICD-10-CM | POA: Insufficient documentation

## 2020-09-07 DIAGNOSIS — H60322 Hemorrhagic otitis externa, left ear: Secondary | ICD-10-CM | POA: Insufficient documentation

## 2020-09-07 DIAGNOSIS — R05 Cough: Secondary | ICD-10-CM | POA: Insufficient documentation

## 2020-09-07 DIAGNOSIS — Y92410 Unspecified street and highway as the place of occurrence of the external cause: Secondary | ICD-10-CM | POA: Diagnosis not present

## 2020-09-07 DIAGNOSIS — M791 Myalgia, unspecified site: Secondary | ICD-10-CM | POA: Diagnosis not present

## 2020-09-07 LAB — I-STAT BETA HCG BLOOD, ED (NOT ORDERABLE): I-stat hCG, quantitative: <5 m[IU]/mL (ref <5)

## 2020-09-07 MED ORDER — CYCLOBENZAPRINE HCL 10 MG PO TABS: 10.0000 mg | ORAL_TABLET | Freq: Three times a day (TID) | ORAL | 0 refills | Status: AC
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
## 2020-11-22 ENCOUNTER — Other Ambulatory Visit: Payer: Self-pay

## 2020-11-22 ENCOUNTER — Emergency Department (HOSPITAL_COMMUNITY)
Admission: EM | Admit: 2020-11-22 | Discharge: 2020-11-23 | Disposition: A | Discharge status: Home / Self Care | Payer: Medicaid Other | Attending: Emergency Medicine | Admitting: Emergency Medicine

## 2020-11-22 ENCOUNTER — Encounter (HOSPITAL_COMMUNITY): Payer: Self-pay | Admitting: Emergency Medicine

## 2020-11-22 DIAGNOSIS — Y906 Blood alcohol level of 120-199 mg/100 ml: Secondary | ICD-10-CM | POA: Insufficient documentation

## 2020-11-22 DIAGNOSIS — F10129 Alcohol abuse with intoxication, unspecified: Secondary | ICD-10-CM | POA: Insufficient documentation

## 2020-11-22 DIAGNOSIS — F3289 Other specified depressive episodes: Secondary | ICD-10-CM

## 2020-11-22 DIAGNOSIS — Z20822 Contact with and (suspected) exposure to covid-19: Secondary | ICD-10-CM | POA: Insufficient documentation

## 2020-11-22 DIAGNOSIS — F10920 Alcohol use, unspecified with intoxication, uncomplicated: Secondary | ICD-10-CM

## 2020-11-22 HISTORY — DX: Bipolar disorder, unspecified: F31.9

## 2020-11-22 MED ORDER — ALUM & MAG HYDROXIDE-SIMETH 200-200-20 MG/5ML PO SUSP: 30.0000 mL | Freq: Four times a day (QID) | ORAL | Status: DC | PRN

## 2020-11-22 MED ORDER — ZOLPIDEM TARTRATE 5 MG PO TABS
5.0000 mg | ORAL_TABLET | Freq: Every evening | ORAL | Status: DC | PRN
  Administered 2020-11-23: 5 mg via ORAL
  Filled 2020-11-22: qty 1

## 2020-11-22 MED ORDER — ACETAMINOPHEN 325 MG PO TABS: 650.0000 mg | ORAL_TABLET | ORAL | Status: DC | PRN

## 2020-11-22 MED ORDER — ONDANSETRON 8 MG PO TBDP
8.0000 mg | ORAL_TABLET | Freq: Once | ORAL | Status: AC
  Administered 2020-11-23: 8 mg via ORAL
  Filled 2020-11-22: qty 1

## 2020-11-22 NOTE — ED Notes (Signed)
After further discussion with patient. Patient states her family put a lot of burden on her to take care of her mother and her mother's mental health. Patient states that she is an only child, and when her mother gets upset, patient's mother and grandmother depend on her for support. Patient states since moving out, the burden has become worse and patient feels she cannot have a life because her mother has "no one else to call." Patient noted to be tangential, with rapid speech, and difficulty staying focused during conversation. Patient states to this writer that she has a history of depression, which she later clarified was "bipolar depression" that she does not take medications for.
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
## 2020-11-23 LAB — CBC WITH DIFFERENTIAL/PLATELET
Abs Immature Granulocytes: 0.02 10*3/uL (ref 0.00–0.07)
Basophils Absolute: 0 10*3/uL (ref 0.0–0.1)
Basophils Relative: 1 %
Eosinophils Absolute: 0 10*3/uL (ref 0.0–0.5)
Eosinophils Relative: 0 %
HCT: 43.2 % (ref 36.0–46.0)
Hemoglobin: 14.7 g/dL (ref 12.0–15.0)
Immature Granulocytes: 0 %
Lymphocytes Relative: 19 %
Lymphs Abs: 1.3 10*3/uL (ref 0.7–4.0)
MCH: 33 pg (ref 26.0–34.0)
MCHC: 34 g/dL (ref 30.0–36.0)
MCV: 97.1 fL (ref 80.0–100.0)
Monocytes Absolute: 0.4 10*3/uL (ref 0.1–1.0)
Monocytes Relative: 6 %
Neutro Abs: 4.8 10*3/uL (ref 1.7–7.7)
Neutrophils Relative %: 74 %
Platelets: 245 10*3/uL (ref 150–400)
RBC: 4.45 MIL/uL (ref 3.87–5.11)
RDW: 12.8 % (ref 11.5–15.5)
WBC: 6.5 10*3/uL (ref 4.0–10.5)
nRBC: 0 % (ref 0.0–0.2)

## 2020-11-23 LAB — RESP PANEL BY RT-PCR (FLU A&B, COVID) ARPGX2
Influenza A by PCR: NEGATIVE
Influenza B by PCR: NEGATIVE
SARS Coronavirus 2 by RT PCR: NEGATIVE

## 2020-11-23 LAB — COMPREHENSIVE METABOLIC PANEL
ALT: 10 U/L (ref 0–44)
AST: 21 U/L (ref 15–41)
Albumin: 4.9 g/dL (ref 3.5–5.0)
Alkaline Phosphatase: 33 U/L — ABNORMAL LOW (ref 38–126)
Anion gap: 12 (ref 5–15)
BUN: 7 mg/dL (ref 6–20)
CO2: 23 mmol/L (ref 22–32)
Calcium: 9 mg/dL (ref 8.9–10.3)
Chloride: 110 mmol/L (ref 98–111)
Creatinine, Ser: 0.68 mg/dL (ref 0.44–1.00)
GFR, Estimated: >60 mL/min (ref >60)
Glucose, Bld: 108 mg/dL — ABNORMAL HIGH (ref 70–99)
Potassium: 4.6 mmol/L (ref 3.5–5.1)
Sodium: 145 mmol/L (ref 135–145)
Total Bilirubin: 0.8 mg/dL (ref 0.3–1.2)
Total Protein: 7.9 g/dL (ref 6.5–8.1)

## 2020-11-23 LAB — HCG, QUANTITATIVE, PREGNANCY: hCG, Beta Chain, Quant, S: <1 m[IU]/mL (ref <5)

## 2020-11-23 LAB — ETHANOL: Alcohol, Ethyl (B): 195 mg/dL — ABNORMAL HIGH (ref <10)
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
## 2020-11-23 NOTE — ED Notes (Addendum)
Patient declined d/c vitals and to sign stating that her ride was here and she needed to leave. Patient ambulatory with steady gait.

## 2020-11-23 NOTE — Discharge Instructions (Addendum)
Return if you are having any problems. 

## 2020-12-21 ENCOUNTER — Inpatient Hospital Stay (HOSPITAL_COMMUNITY): Payer: 59

## 2020-12-21 ENCOUNTER — Emergency Department (HOSPITAL_COMMUNITY): Payer: 59

## 2020-12-21 ENCOUNTER — Inpatient Hospital Stay (HOSPITAL_COMMUNITY)
Admission: EM | Admit: 2020-12-21 | Discharge: 2021-01-01 | DRG: 963 | Disposition: A | Discharge status: Rehabilitation Facility | Payer: 59 | Attending: General Surgery | Admitting: General Surgery

## 2020-12-21 DIAGNOSIS — F10239 Alcohol dependence with withdrawal, unspecified: Secondary | ICD-10-CM | POA: Diagnosis not present

## 2020-12-21 DIAGNOSIS — S270XXA Traumatic pneumothorax, initial encounter: Secondary | ICD-10-CM | POA: Diagnosis present

## 2020-12-21 DIAGNOSIS — S2243XA Multiple fractures of ribs, bilateral, initial encounter for closed fracture: Secondary | ICD-10-CM | POA: Diagnosis present

## 2020-12-21 DIAGNOSIS — S0101XA Laceration without foreign body of scalp, initial encounter: Secondary | ICD-10-CM

## 2020-12-21 DIAGNOSIS — I808 Phlebitis and thrombophlebitis of other sites: Secondary | ICD-10-CM | POA: Diagnosis present

## 2020-12-21 DIAGNOSIS — N390 Urinary tract infection, site not specified: Secondary | ICD-10-CM | POA: Diagnosis present

## 2020-12-21 DIAGNOSIS — S36115A Moderate laceration of liver, initial encounter: Secondary | ICD-10-CM | POA: Diagnosis present

## 2020-12-21 DIAGNOSIS — R011 Cardiac murmur, unspecified: Secondary | ICD-10-CM | POA: Diagnosis not present

## 2020-12-21 DIAGNOSIS — M542 Cervicalgia: Secondary | ICD-10-CM

## 2020-12-21 DIAGNOSIS — D62 Acute posthemorrhagic anemia: Secondary | ICD-10-CM | POA: Diagnosis not present

## 2020-12-21 DIAGNOSIS — S065X9A Traumatic subdural hemorrhage with loss of consciousness of unspecified duration, initial encounter: Secondary | ICD-10-CM | POA: Diagnosis present

## 2020-12-21 DIAGNOSIS — Y907 Blood alcohol level of 200-239 mg/100 ml: Secondary | ICD-10-CM | POA: Diagnosis present

## 2020-12-21 DIAGNOSIS — G9340 Encephalopathy, unspecified: Secondary | ICD-10-CM | POA: Diagnosis present

## 2020-12-21 DIAGNOSIS — R000 Tachycardia, unspecified: Secondary | ICD-10-CM | POA: Diagnosis not present

## 2020-12-21 DIAGNOSIS — B9620 Unspecified Escherichia coli [E. coli] as the cause of diseases classified elsewhere: Secondary | ICD-10-CM | POA: Diagnosis not present

## 2020-12-21 DIAGNOSIS — S52615A Nondisplaced fracture of left ulna styloid process, initial encounter for closed fracture: Secondary | ICD-10-CM | POA: Diagnosis present

## 2020-12-21 DIAGNOSIS — R4189 Other symptoms and signs involving cognitive functions and awareness: Secondary | ICD-10-CM | POA: Diagnosis present

## 2020-12-21 DIAGNOSIS — S066X9A Traumatic subarachnoid hemorrhage with loss of consciousness of unspecified duration, initial encounter: Principal | ICD-10-CM | POA: Diagnosis present

## 2020-12-21 DIAGNOSIS — R402432 Glasgow coma scale score 3-8, at arrival to emergency department: Secondary | ICD-10-CM | POA: Diagnosis present

## 2020-12-21 DIAGNOSIS — Z8249 Family history of ischemic heart disease and other diseases of the circulatory system: Secondary | ICD-10-CM

## 2020-12-21 DIAGNOSIS — K5900 Constipation, unspecified: Secondary | ICD-10-CM | POA: Diagnosis not present

## 2020-12-21 DIAGNOSIS — F10220 Alcohol dependence with intoxication, uncomplicated: Secondary | ICD-10-CM | POA: Diagnosis present

## 2020-12-21 DIAGNOSIS — Z9911 Dependence on respirator [ventilator] status: Secondary | ICD-10-CM

## 2020-12-21 DIAGNOSIS — G959 Disease of spinal cord, unspecified: Secondary | ICD-10-CM | POA: Diagnosis present

## 2020-12-21 DIAGNOSIS — I7771 Dissection of carotid artery: Secondary | ICD-10-CM | POA: Diagnosis present

## 2020-12-21 DIAGNOSIS — I7774 Dissection of vertebral artery: Secondary | ICD-10-CM | POA: Diagnosis present

## 2020-12-21 DIAGNOSIS — T1490XA Injury, unspecified, initial encounter: Secondary | ICD-10-CM

## 2020-12-21 DIAGNOSIS — Z23 Encounter for immunization: Secondary | ICD-10-CM

## 2020-12-21 DIAGNOSIS — E232 Diabetes insipidus: Secondary | ICD-10-CM | POA: Diagnosis present

## 2020-12-21 DIAGNOSIS — S36113A Laceration of liver, unspecified degree, initial encounter: Secondary | ICD-10-CM

## 2020-12-21 DIAGNOSIS — G379 Demyelinating disease of central nervous system, unspecified: Secondary | ICD-10-CM | POA: Diagnosis not present

## 2020-12-21 DIAGNOSIS — R609 Edema, unspecified: Secondary | ICD-10-CM | POA: Diagnosis present

## 2020-12-21 DIAGNOSIS — S069X1A Unspecified intracranial injury with loss of consciousness of 30 minutes or less, initial encounter: Secondary | ICD-10-CM | POA: Diagnosis not present

## 2020-12-21 DIAGNOSIS — S2249XA Multiple fractures of ribs, unspecified side, initial encounter for closed fracture: Secondary | ICD-10-CM

## 2020-12-21 DIAGNOSIS — S065XAA Traumatic subdural hemorrhage with loss of consciousness status unknown, initial encounter: Secondary | ICD-10-CM

## 2020-12-21 DIAGNOSIS — T8089XA Other complications following infusion, transfusion and therapeutic injection, initial encounter: Secondary | ICD-10-CM | POA: Diagnosis present

## 2020-12-21 DIAGNOSIS — S069X0S Unspecified intracranial injury without loss of consciousness, sequela: Secondary | ICD-10-CM | POA: Diagnosis not present

## 2020-12-21 DIAGNOSIS — S066X9D Traumatic subarachnoid hemorrhage with loss of consciousness of unspecified duration, subsequent encounter: Secondary | ICD-10-CM | POA: Diagnosis not present

## 2020-12-21 DIAGNOSIS — Y92410 Unspecified street and highway as the place of occurrence of the external cause: Secondary | ICD-10-CM

## 2020-12-21 DIAGNOSIS — S15101A Unspecified injury of right vertebral artery, initial encounter: Secondary | ICD-10-CM | POA: Diagnosis present

## 2020-12-21 DIAGNOSIS — D72829 Elevated white blood cell count, unspecified: Secondary | ICD-10-CM

## 2020-12-21 DIAGNOSIS — Z978 Presence of other specified devices: Secondary | ICD-10-CM

## 2020-12-21 DIAGNOSIS — J939 Pneumothorax, unspecified: Secondary | ICD-10-CM

## 2020-12-21 DIAGNOSIS — R4689 Other symptoms and signs involving appearance and behavior: Secondary | ICD-10-CM | POA: Diagnosis not present

## 2020-12-21 DIAGNOSIS — G909 Disorder of the autonomic nervous system, unspecified: Secondary | ICD-10-CM | POA: Diagnosis present

## 2020-12-21 DIAGNOSIS — Z793 Long term (current) use of hormonal contraceptives: Secondary | ICD-10-CM

## 2020-12-21 DIAGNOSIS — Z20822 Contact with and (suspected) exposure to covid-19: Secondary | ICD-10-CM | POA: Diagnosis present

## 2020-12-21 DIAGNOSIS — T07XXXA Unspecified multiple injuries, initial encounter: Secondary | ICD-10-CM | POA: Diagnosis not present

## 2020-12-21 DIAGNOSIS — S080XXA Avulsion of scalp, initial encounter: Secondary | ICD-10-CM | POA: Diagnosis present

## 2020-12-21 DIAGNOSIS — K5901 Slow transit constipation: Secondary | ICD-10-CM | POA: Diagnosis not present

## 2020-12-21 DIAGNOSIS — S069X3S Unspecified intracranial injury with loss of consciousness of 1 hour to 5 hours 59 minutes, sequela: Secondary | ICD-10-CM | POA: Diagnosis not present

## 2020-12-21 DIAGNOSIS — R4586 Emotional lability: Secondary | ICD-10-CM | POA: Diagnosis present

## 2020-12-21 DIAGNOSIS — F10920 Alcohol use, unspecified with intoxication, uncomplicated: Secondary | ICD-10-CM

## 2020-12-21 DIAGNOSIS — F419 Anxiety disorder, unspecified: Secondary | ICD-10-CM | POA: Diagnosis present

## 2020-12-21 DIAGNOSIS — R454 Irritability and anger: Secondary | ICD-10-CM | POA: Diagnosis not present

## 2020-12-21 LAB — COMPREHENSIVE METABOLIC PANEL
ALT: 128 U/L — ABNORMAL HIGH (ref 0–44)
ALT: 118 U/L — ABNORMAL HIGH (ref 0–44)
AST: 226 U/L — ABNORMAL HIGH (ref 15–41)
AST: 216 U/L — ABNORMAL HIGH (ref 15–41)
Albumin: 4.2 g/dL (ref 3.5–5.0)
Albumin: 3.6 g/dL (ref 3.5–5.0)
Alkaline Phosphatase: 37 U/L — ABNORMAL LOW (ref 38–126)
Alkaline Phosphatase: 33 U/L — ABNORMAL LOW (ref 38–126)
Anion gap: 13 (ref 5–15)
Anion gap: 12 (ref 5–15)
BUN: 8 mg/dL (ref 6–20)
BUN: 5 mg/dL — ABNORMAL LOW (ref 6–20)
CO2: 21 mmol/L — ABNORMAL LOW (ref 22–32)
CO2: 21 mmol/L — ABNORMAL LOW (ref 22–32)
Calcium: 8.6 mg/dL — ABNORMAL LOW (ref 8.9–10.3)
Calcium: 8.2 mg/dL — ABNORMAL LOW (ref 8.9–10.3)
Chloride: 109 mmol/L (ref 98–111)
Chloride: 118 mmol/L — ABNORMAL HIGH (ref 98–111)
Creatinine, Ser: 0.76 mg/dL (ref 0.44–1.00)
Creatinine, Ser: 0.69 mg/dL (ref 0.44–1.00)
GFR, Estimated: >60 mL/min (ref >60)
GFR, Estimated: >60 mL/min (ref >60)
Glucose, Bld: 168 mg/dL — ABNORMAL HIGH (ref 70–99)
Glucose, Bld: 135 mg/dL — ABNORMAL HIGH (ref 70–99)
Potassium: 3.3 mmol/L — ABNORMAL LOW (ref 3.5–5.1)
Potassium: 3.3 mmol/L — ABNORMAL LOW (ref 3.5–5.1)
Sodium: 143 mmol/L (ref 135–145)
Sodium: 151 mmol/L — ABNORMAL HIGH (ref 135–145)
Total Bilirubin: 0.6 mg/dL (ref 0.3–1.2)
Total Bilirubin: 0.5 mg/dL (ref 0.3–1.2)
Total Protein: 7 g/dL (ref 6.5–8.1)
Total Protein: 6 g/dL — ABNORMAL LOW (ref 6.5–8.1)

## 2020-12-21 LAB — URINALYSIS, ROUTINE W REFLEX MICROSCOPIC
Bilirubin Urine: NEGATIVE
Glucose, UA: NEGATIVE mg/dL
Ketones, ur: NEGATIVE mg/dL
Leukocytes,Ua: NEGATIVE
Nitrite: NEGATIVE
Protein, ur: NEGATIVE mg/dL
Specific Gravity, Urine: 1.009 (ref 1.005–1.030)
pH: 6 (ref 5.0–8.0)

## 2020-12-21 LAB — CBC
HCT: 41.7 % (ref 36.0–46.0)
HCT: 36.9 % (ref 36.0–46.0)
Hemoglobin: 13.2 g/dL (ref 12.0–15.0)
Hemoglobin: 11.7 g/dL — ABNORMAL LOW (ref 12.0–15.0)
MCH: 32.3 pg (ref 26.0–34.0)
MCH: 31.9 pg (ref 26.0–34.0)
MCHC: 31.7 g/dL (ref 30.0–36.0)
MCHC: 31.7 g/dL (ref 30.0–36.0)
MCV: 102 fL — ABNORMAL HIGH (ref 80.0–100.0)
MCV: 100.5 fL — ABNORMAL HIGH (ref 80.0–100.0)
Platelets: 323 10*3/uL (ref 150–400)
Platelets: 271 10*3/uL (ref 150–400)
RBC: 4.09 MIL/uL (ref 3.87–5.11)
RBC: 3.67 MIL/uL — ABNORMAL LOW (ref 3.87–5.11)
RDW: 12.3 % (ref 11.5–15.5)
RDW: 12.5 % (ref 11.5–15.5)
WBC: 13.5 10*3/uL — ABNORMAL HIGH (ref 4.0–10.5)
WBC: 28.3 10*3/uL — ABNORMAL HIGH (ref 4.0–10.5)
nRBC: 0 % (ref 0.0–0.2)
nRBC: 0 % (ref 0.0–0.2)

## 2020-12-21 LAB — I-STAT ARTERIAL BLOOD GAS, ED
Acid-base deficit: 7 mmol/L — ABNORMAL HIGH (ref 0.0–2.0)
Bicarbonate: 20.7 mmol/L (ref 20.0–28.0)
Calcium, Ion: 1.15 mmol/L (ref 1.15–1.40)
HCT: 31 % — ABNORMAL LOW (ref 36.0–46.0)
Hemoglobin: 10.5 g/dL — ABNORMAL LOW (ref 12.0–15.0)
O2 Saturation: 100 %
Patient temperature: 96.9
Potassium: 2.7 mmol/L — CL (ref 3.5–5.1)
Sodium: 146 mmol/L — ABNORMAL HIGH (ref 135–145)
TCO2: 22 mmol/L (ref 22–32)
pCO2 arterial: 45.8 mmHg (ref 32.0–48.0)
pH, Arterial: 7.258 — ABNORMAL LOW (ref 7.350–7.450)
pO2, Arterial: 527 mmHg — ABNORMAL HIGH (ref 83.0–108.0)

## 2020-12-21 LAB — RESP PANEL BY RT-PCR (FLU A&B, COVID) ARPGX2
Influenza A by PCR: NEGATIVE
Influenza B by PCR: NEGATIVE
SARS Coronavirus 2 by RT PCR: NEGATIVE

## 2020-12-21 LAB — PROTIME-INR
INR: 1.3 — ABNORMAL HIGH (ref 0.8–1.2)
Prothrombin Time: 15.9 seconds — ABNORMAL HIGH (ref 11.4–15.2)

## 2020-12-21 LAB — I-STAT CHEM 8, ED
BUN: 8 mg/dL (ref 6–20)
Calcium, Ion: 1.11 mmol/L — ABNORMAL LOW (ref 1.15–1.40)
Chloride: 109 mmol/L (ref 98–111)
Creatinine, Ser: 0.9 mg/dL (ref 0.44–1.00)
Glucose, Bld: 160 mg/dL — ABNORMAL HIGH (ref 70–99)
HCT: 41 % (ref 36.0–46.0)
Hemoglobin: 13.9 g/dL (ref 12.0–15.0)
Potassium: 3.1 mmol/L — ABNORMAL LOW (ref 3.5–5.1)
Sodium: 145 mmol/L (ref 135–145)
TCO2: 20 mmol/L — ABNORMAL LOW (ref 22–32)

## 2020-12-21 LAB — HIV ANTIBODY (ROUTINE TESTING W REFLEX): HIV Screen 4th Generation wRfx: NONREACTIVE

## 2020-12-21 LAB — I-STAT BETA HCG BLOOD, ED (MC, WL, AP ONLY): I-stat hCG, quantitative: <5 m[IU]/mL (ref <5)

## 2020-12-21 LAB — LACTIC ACID, PLASMA
Lactic Acid, Venous: 4.9 mmol/L (ref 0.5–1.9)
Lactic Acid, Venous: 3.7 mmol/L (ref 0.5–1.9)

## 2020-12-21 LAB — TRIGLYCERIDES
Triglycerides: 97 mg/dL (ref <150)
Triglycerides: 122 mg/dL

## 2020-12-21 LAB — MRSA PCR SCREENING: MRSA by PCR: NEGATIVE

## 2020-12-21 LAB — GLUCOSE, CAPILLARY
Glucose-Capillary: 113 mg/dL — ABNORMAL HIGH (ref 70–99)
Glucose-Capillary: 126 mg/dL — ABNORMAL HIGH (ref 70–99)

## 2020-12-21 LAB — SAMPLE TO BLOOD BANK

## 2020-12-21 LAB — ETHANOL: Alcohol, Ethyl (B): 226 mg/dL — ABNORMAL HIGH (ref <10)

## 2020-12-21 MED ORDER — ETOMIDATE 2 MG/ML IV SOLN
INTRAVENOUS | Status: AC | PRN
  Administered 2020-12-21: 20 mg via INTRAVENOUS

## 2020-12-21 MED ORDER — DEXMEDETOMIDINE HCL IN NACL 400 MCG/100ML IV SOLN
0.2000 ug/kg/h | INTRAVENOUS | Status: DC
  Administered 2020-12-21: 0.2 ug/kg/h via INTRAVENOUS
  Administered 2020-12-22: 0.6 ug/kg/h via INTRAVENOUS
  Administered 2020-12-23: 0.5 ug/kg/h via INTRAVENOUS
  Administered 2020-12-23: 0.4 ug/kg/h via INTRAVENOUS
  Administered 2020-12-24 – 2020-12-28 (×2): 0.7 ug/kg/h via INTRAVENOUS
  Filled 2020-12-21 (×6): qty 100

## 2020-12-21 MED ORDER — ASPIRIN 81 MG PO CHEW
81.0000 mg | CHEWABLE_TABLET | Freq: Every day | ORAL | Status: DC
  Administered 2020-12-21 – 2021-01-01 (×11): 81 mg
  Filled 2020-12-21 (×11): qty 1

## 2020-12-21 MED ORDER — MORPHINE SULFATE (PF) 2 MG/ML IV SOLN
2.0000 mg | INTRAVENOUS | Status: DC | PRN
Start: 2020-12-21 — End: 2020-12-21

## 2020-12-21 MED ORDER — LEVETIRACETAM IN NACL 500 MG/100ML IV SOLN
500.0000 mg | Freq: Two times a day (BID) | INTRAVENOUS | Status: AC
  Administered 2020-12-21 – 2020-12-27 (×14): 500 mg via INTRAVENOUS
  Filled 2020-12-21 (×13): qty 100

## 2020-12-21 MED ORDER — POTASSIUM CHLORIDE 2 MEQ/ML IV SOLN
INTRAVENOUS | Status: DC
  Filled 2020-12-21 (×3): qty 1000

## 2020-12-21 MED ORDER — KCL IN DEXTROSE-NACL 20-5-0.9 MEQ/L-%-% IV SOLN
INTRAVENOUS | Status: DC
  Filled 2020-12-21 (×2): qty 1000

## 2020-12-21 MED ORDER — CHLORHEXIDINE GLUCONATE CLOTH 2 % EX PADS
6.0000 | MEDICATED_PAD | Freq: Every day | CUTANEOUS | Status: DC
  Administered 2020-12-21 – 2020-12-24 (×4): 6 via TOPICAL

## 2020-12-21 MED ORDER — FENTANYL CITRATE (PF) 100 MCG/2ML IJ SOLN
INTRAMUSCULAR | Status: AC | PRN
  Administered 2020-12-21: 50 ug via INTRAVENOUS

## 2020-12-21 MED ORDER — FENTANYL 2500MCG IN NS 250ML (10MCG/ML) PREMIX INFUSION
0.0000 ug/h | INTRAVENOUS | Status: DC
  Administered 2020-12-21: 25 ug/h via INTRAVENOUS
  Filled 2020-12-21: qty 250

## 2020-12-21 MED ORDER — IOHEXOL 350 MG/ML SOLN
50.0000 mL | Freq: Once | INTRAVENOUS | Status: AC | PRN
  Administered 2020-12-21: 50 mL via INTRAVENOUS

## 2020-12-21 MED ORDER — PROPOFOL 1000 MG/100ML IV EMUL: 0.0000 ug/kg/min | INTRAVENOUS | Status: DC

## 2020-12-21 MED ORDER — ROCURONIUM BROMIDE 50 MG/5ML IV SOLN
INTRAVENOUS | Status: AC | PRN
Start: 2020-12-21 — End: 2020-12-21
  Administered 2020-12-21: 75 mg via INTRAVENOUS

## 2020-12-21 MED ORDER — CHLORHEXIDINE GLUCONATE 0.12% ORAL RINSE (MEDLINE KIT)
15.0000 mL | Freq: Two times a day (BID) | OROMUCOSAL | Status: DC
  Administered 2020-12-21 – 2021-01-01 (×18): 15 mL via OROMUCOSAL

## 2020-12-21 MED ORDER — PROPOFOL 1000 MG/100ML IV EMUL
INTRAVENOUS | Status: AC
  Administered 2020-12-21: 15 ug/kg/min via INTRAVENOUS
  Filled 2020-12-21: qty 100

## 2020-12-21 MED ORDER — ORAL CARE MOUTH RINSE
15.0000 mL | OROMUCOSAL | Status: DC
  Administered 2020-12-21 – 2020-12-22 (×9): 15 mL via OROMUCOSAL

## 2020-12-21 MED ORDER — KCL IN DEXTROSE-NACL 40-5-0.45 MEQ/L-%-% IV SOLN
INTRAVENOUS | Status: DC
  Filled 2020-12-21 (×13): qty 1000

## 2020-12-21 MED ORDER — FENTANYL CITRATE (PF) 100 MCG/2ML IJ SOLN
INTRAMUSCULAR | Status: AC
  Filled 2020-12-21: qty 2

## 2020-12-21 MED ORDER — ENOXAPARIN SODIUM 30 MG/0.3ML ~~LOC~~ SOLN
30.0000 mg | Freq: Two times a day (BID) | SUBCUTANEOUS | Status: DC
  Administered 2020-12-24 – 2021-01-01 (×17): 30 mg via SUBCUTANEOUS
  Filled 2020-12-21 (×17): qty 0.3

## 2020-12-21 MED ORDER — POTASSIUM CHLORIDE 10 MEQ/100ML IV SOLN
10.0000 meq | INTRAVENOUS | Status: AC
Start: 2020-12-21 — End: 2020-12-21
  Administered 2020-12-21 (×6): 10 meq via INTRAVENOUS
  Filled 2020-12-21 (×6): qty 100

## 2020-12-21 MED ORDER — ENOXAPARIN SODIUM 30 MG/0.3ML ~~LOC~~ SOLN: 30.0000 mg | Freq: Two times a day (BID) | SUBCUTANEOUS | Status: DC

## 2020-12-21 MED ORDER — LACTATED RINGERS IV BOLUS
1000.0000 mL | Freq: Once | INTRAVENOUS | Status: AC
  Administered 2020-12-21: 1000 mL via INTRAVENOUS

## 2020-12-21 MED ORDER — PROPOFOL 1000 MG/100ML IV EMUL: 5.0000 ug/kg/min | INTRAVENOUS | Status: DC

## 2020-12-21 MED ORDER — ONDANSETRON HCL 4 MG/2ML IJ SOLN
4.0000 mg | Freq: Four times a day (QID) | INTRAMUSCULAR | Status: DC | PRN
  Administered 2020-12-23 – 2020-12-25 (×3): 4 mg via INTRAVENOUS
  Filled 2020-12-21 (×3): qty 2

## 2020-12-21 MED ORDER — IOHEXOL 300 MG/ML  SOLN
100.0000 mL | Freq: Once | INTRAMUSCULAR | Status: AC | PRN
  Administered 2020-12-21: 100 mL via INTRAVENOUS

## 2020-12-21 MED ORDER — PROPOFOL 1000 MG/100ML IV EMUL
INTRAVENOUS | Status: AC
  Administered 2020-12-21: 25 ug/kg/min via INTRAVENOUS
  Filled 2020-12-21: qty 100

## 2020-12-21 MED ORDER — MIDAZOLAM HCL 2 MG/2ML IJ SOLN
INTRAMUSCULAR | Status: AC
  Filled 2020-12-21: qty 6

## 2020-12-21 MED ORDER — METOPROLOL TARTRATE 5 MG/5ML IV SOLN
5.0000 mg | Freq: Four times a day (QID) | INTRAVENOUS | Status: AC | PRN
Start: 2020-12-21 — End: 2020-12-30
  Administered 2020-12-29 – 2020-12-30 (×4): 5 mg via INTRAVENOUS
  Filled 2020-12-21 (×4): qty 5

## 2020-12-21 MED ORDER — ONDANSETRON 4 MG PO TBDP
4.0000 mg | ORAL_TABLET | Freq: Four times a day (QID) | ORAL | Status: DC | PRN
  Filled 2020-12-21: qty 1
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
## 2020-12-21 NOTE — Plan of Care (Signed)
  Problem: Education: Goal: Knowledge of General Education information will improve Description Including pain rating scale, medication(s)/side effects and non-pharmacologic comfort measures Outcome: Progressing   Problem: Clinical Measurements: Goal: Ability to maintain clinical measurements within normal limits will improve Outcome: Progressing   Problem: Clinical Measurements: Goal: Will remain free from infection Outcome: Progressing   
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
## 2020-12-21 NOTE — ED Notes (Signed)
Attempted to call Neurosurgery for consult for patient. System is currently down and neurosurgery asked that we call back in 5-10 mins
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
## 2020-12-21 NOTE — Progress Notes (Signed)
Patient transported to CT and back to 2H with no complications.

## 2020-12-21 NOTE — Progress Notes (Signed)
Patient transported from ED Trauma B to 2H05 with no complications.
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
## 2020-12-22 ENCOUNTER — Inpatient Hospital Stay (HOSPITAL_COMMUNITY): Payer: 59

## 2020-12-22 LAB — CBC
HCT: 27.1 % — ABNORMAL LOW (ref 36.0–46.0)
HCT: 23.7 % — ABNORMAL LOW (ref 36.0–46.0)
HCT: 22.1 % — ABNORMAL LOW (ref 36.0–46.0)
Hemoglobin: 8.6 g/dL — ABNORMAL LOW (ref 12.0–15.0)
Hemoglobin: 7.4 g/dL — ABNORMAL LOW (ref 12.0–15.0)
Hemoglobin: 7 g/dL — ABNORMAL LOW (ref 12.0–15.0)
MCH: 32 pg (ref 26.0–34.0)
MCH: 32.7 pg (ref 26.0–34.0)
MCH: 33.2 pg (ref 26.0–34.0)
MCHC: 31.7 g/dL (ref 30.0–36.0)
MCHC: 31.2 g/dL (ref 30.0–36.0)
MCHC: 31.7 g/dL (ref 30.0–36.0)
MCV: 100.7 fL — ABNORMAL HIGH (ref 80.0–100.0)
MCV: 104.9 fL — ABNORMAL HIGH (ref 80.0–100.0)
MCV: 104.7 fL — ABNORMAL HIGH (ref 80.0–100.0)
Platelets: 150 10*3/uL (ref 150–400)
Platelets: 114 10*3/uL — ABNORMAL LOW (ref 150–400)
Platelets: 104 10*3/uL — ABNORMAL LOW (ref 150–400)
RBC: 2.69 MIL/uL — ABNORMAL LOW (ref 3.87–5.11)
RBC: 2.26 MIL/uL — ABNORMAL LOW (ref 3.87–5.11)
RBC: 2.11 MIL/uL — ABNORMAL LOW (ref 3.87–5.11)
RDW: 13.1 % (ref 11.5–15.5)
RDW: 12.8 % (ref 11.5–15.5)
RDW: 12.7 % (ref 11.5–15.5)
WBC: 14.1 10*3/uL — ABNORMAL HIGH (ref 4.0–10.5)
WBC: 14.9 10*3/uL — ABNORMAL HIGH (ref 4.0–10.5)
WBC: 12 10*3/uL — ABNORMAL HIGH (ref 4.0–10.5)
nRBC: 0 % (ref 0.0–0.2)
nRBC: 0 % (ref 0.0–0.2)
nRBC: 0 % (ref 0.0–0.2)

## 2020-12-22 LAB — COMPREHENSIVE METABOLIC PANEL
ALT: 71 U/L — ABNORMAL HIGH (ref 0–44)
AST: 58 U/L — ABNORMAL HIGH (ref 15–41)
Albumin: 2.8 g/dL — ABNORMAL LOW (ref 3.5–5.0)
Alkaline Phosphatase: 27 U/L — ABNORMAL LOW (ref 38–126)
Anion gap: 8 (ref 5–15)
BUN: 7 mg/dL (ref 6–20)
CO2: 21 mmol/L — ABNORMAL LOW (ref 22–32)
Calcium: 7.9 mg/dL — ABNORMAL LOW (ref 8.9–10.3)
Chloride: 120 mmol/L — ABNORMAL HIGH (ref 98–111)
Creatinine, Ser: 0.62 mg/dL (ref 0.44–1.00)
GFR, Estimated: >60 mL/min (ref >60)
Glucose, Bld: 144 mg/dL — ABNORMAL HIGH (ref 70–99)
Potassium: 4.2 mmol/L (ref 3.5–5.1)
Sodium: 149 mmol/L — ABNORMAL HIGH (ref 135–145)
Total Bilirubin: 0.5 mg/dL (ref 0.3–1.2)
Total Protein: 5.1 g/dL — ABNORMAL LOW (ref 6.5–8.1)

## 2020-12-22 LAB — GLUCOSE, CAPILLARY: Glucose-Capillary: 138 mg/dL — ABNORMAL HIGH (ref 70–99)

## 2020-12-22 MED ORDER — METHOCARBAMOL 500 MG PO TABS
500.0000 mg | ORAL_TABLET | Freq: Three times a day (TID) | ORAL | Status: DC
  Administered 2020-12-22 – 2020-12-26 (×17): 500 mg via ORAL
  Filled 2020-12-22 (×17): qty 1

## 2020-12-22 MED ORDER — OXYCODONE HCL 5 MG PO TABS
5.0000 mg | ORAL_TABLET | ORAL | Status: DC | PRN
Start: 2020-12-22 — End: 2021-01-01
  Administered 2020-12-22 – 2020-12-24 (×5): 10 mg via ORAL
  Administered 2020-12-24 (×2): 5 mg via ORAL
  Administered 2020-12-25: 10 mg via ORAL
  Administered 2020-12-27 (×2): 5 mg via ORAL
  Administered 2020-12-28 – 2020-12-31 (×9): 10 mg via ORAL
  Administered 2020-12-31: 5 mg via ORAL
  Administered 2020-12-31: 10 mg via ORAL
  Administered 2021-01-01: 5 mg via ORAL
  Administered 2021-01-01: 10 mg via ORAL
  Filled 2020-12-22 (×3): qty 2
  Filled 2020-12-22: qty 1
  Filled 2020-12-22 (×2): qty 2
  Filled 2020-12-22 (×2): qty 1
  Filled 2020-12-22 (×2): qty 2
  Filled 2020-12-22: qty 1
  Filled 2020-12-22 (×6): qty 2
  Filled 2020-12-22: qty 1
  Filled 2020-12-22 (×6): qty 2
  Filled 2020-12-22: qty 1
  Filled 2020-12-22: qty 2

## 2020-12-22 MED ORDER — SODIUM CHLORIDE 0.9 % IV BOLUS
500.0000 mL | Freq: Once | INTRAVENOUS | Status: AC
  Administered 2020-12-22: 500 mL via INTRAVENOUS

## 2020-12-22 MED ORDER — ACETAMINOPHEN 500 MG PO TABS
1000.0000 mg | ORAL_TABLET | Freq: Four times a day (QID) | ORAL | Status: DC
  Administered 2020-12-22 – 2021-01-01 (×33): 1000 mg via ORAL
  Filled 2020-12-22 (×36): qty 2

## 2020-12-22 MED ORDER — HYDROMORPHONE HCL 1 MG/ML IJ SOLN
0.5000 mg | INTRAMUSCULAR | Status: DC | PRN
  Administered 2020-12-22 – 2020-12-24 (×6): 1 mg via INTRAVENOUS
  Administered 2020-12-24: 0.5 mg via INTRAVENOUS
  Administered 2020-12-24 – 2020-12-25 (×6): 1 mg via INTRAVENOUS
  Administered 2020-12-25: 0.5 mg via INTRAVENOUS
  Administered 2020-12-25 – 2020-12-27 (×9): 1 mg via INTRAVENOUS
  Filled 2020-12-22 (×23): qty 1

## 2020-12-22 MED ORDER — LACTATED RINGERS IV BOLUS
500.0000 mL | Freq: Once | INTRAVENOUS | Status: AC
  Administered 2020-12-22: 500 mL via INTRAVENOUS

## 2020-12-22 NOTE — Progress Notes (Signed)
Orthopedic Tech Progress Note Patient Details:  Labria Wos [birth year] [id number]  Ortho Devices Type of Ortho Device: Velcro wrist splint Ortho Device/Splint Location: LUE Ortho Device/Splint Interventions: Ordered,Application,Adjustment   Post Interventions Patient Tolerated: Well Instructions Provided: Adjustment of device,Care of device,Poper ambulation with device   Julicia Krieger 12/22/2020, 9:59 AM
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
## 2020-12-22 NOTE — Progress Notes (Signed)
Extubated this AM. No focal deficits upon extubation. GCS 15. Continue ASA 81mg  PO QD. Following along with you.  . Rande Brunt, MD [facility] Phone Number: [phone] 12/22/2020 9:56 AM

## 2020-12-22 NOTE — Procedures (Signed)
Extubation Procedure Note  Patient Details:   Name: Denise [name] DOB: 02-02-1996 MRN: [id number]   Airway Documentation:    Vent end date: 12/22/20 Vent end time: 0748   Evaluation  O2 sats: stable throughout Complications: No apparent complications Patient did tolerate procedure well. Bilateral Breath Sounds: Clear,Other (Comment) (agitated)   Yes   Positive cuff leak noted. Patient placed on [place] 4L with humidity, no stridor noted.  Rayburn Felt 12/22/2020, 8:43 AM
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
## 2020-12-22 NOTE — Progress Notes (Incomplete)
Contacted trauma MD, Dr. Freida Busman at 2030 for continued low blood pressures despite three fluid bolus during day shift. Cut precedex from 0.7 to 0.2 per doctor order.   2130- followed up with MD. No improvement in blood pressures. STAT CBC ordered per MD.  2340- contacted trauma MD, Dr. Freida Busman with hemoglobin of 7. Wants MAP to stay "around 60" per doctor. Orders updated to reflect doctors verbal order.

## 2020-12-23 ENCOUNTER — Inpatient Hospital Stay (HOSPITAL_COMMUNITY): Payer: 59

## 2020-12-23 LAB — BASIC METABOLIC PANEL
Anion gap: 7 (ref 5–15)
BUN: 6 mg/dL (ref 6–20)
CO2: 23 mmol/L (ref 22–32)
Calcium: 8.1 mg/dL — ABNORMAL LOW (ref 8.9–10.3)
Chloride: 118 mmol/L — ABNORMAL HIGH (ref 98–111)
Creatinine, Ser: 0.53 mg/dL (ref 0.44–1.00)
GFR, Estimated: >60 mL/min (ref >60)
Glucose, Bld: 128 mg/dL — ABNORMAL HIGH (ref 70–99)
Potassium: 3.8 mmol/L (ref 3.5–5.1)
Sodium: 148 mmol/L — ABNORMAL HIGH (ref 135–145)

## 2020-12-23 LAB — CBC
HCT: 22.7 % — ABNORMAL LOW (ref 36.0–46.0)
Hemoglobin: 7.3 g/dL — ABNORMAL LOW (ref 12.0–15.0)
MCH: 33.5 pg (ref 26.0–34.0)
MCHC: 32.2 g/dL (ref 30.0–36.0)
MCV: 104.1 fL — ABNORMAL HIGH (ref 80.0–100.0)
Platelets: 106 10*3/uL — ABNORMAL LOW (ref 150–400)
RBC: 2.18 MIL/uL — ABNORMAL LOW (ref 3.87–5.11)
RDW: 12.8 % (ref 11.5–15.5)
WBC: 12.1 10*3/uL — ABNORMAL HIGH (ref 4.0–10.5)
nRBC: 0 % (ref 0.0–0.2)

## 2020-12-23 MED ORDER — ADULT MULTIVITAMIN W/MINERALS CH
1.0000 | ORAL_TABLET | Freq: Every day | ORAL | Status: DC
  Administered 2020-12-23 – 2021-01-01 (×9): 1 via ORAL
  Filled 2020-12-23 (×9): qty 1

## 2020-12-23 MED ORDER — LORAZEPAM 1 MG PO TABS
1.0000 mg | ORAL_TABLET | ORAL | Status: DC | PRN
  Administered 2020-12-23 – 2020-12-25 (×7): 1 mg via ORAL
  Filled 2020-12-23 (×7): qty 1

## 2020-12-23 MED ORDER — THIAMINE HCL 100 MG/ML IJ SOLN
100.0000 mg | Freq: Once | INTRAMUSCULAR | Status: AC
  Administered 2020-12-23: 100 mg via INTRAMUSCULAR
  Filled 2020-12-23: qty 2

## 2020-12-23 MED ORDER — THIAMINE HCL 100 MG PO TABS
100.0000 mg | ORAL_TABLET | Freq: Every day | ORAL | Status: DC
  Administered 2020-12-24 – 2021-01-01 (×8): 100 mg via ORAL
  Filled 2020-12-23 (×8): qty 1
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
## 2020-12-24 DIAGNOSIS — S069X1A Unspecified intracranial injury with loss of consciousness of 30 minutes or less, initial encounter: Secondary | ICD-10-CM | POA: Diagnosis not present

## 2020-12-24 DIAGNOSIS — B9620 Unspecified Escherichia coli [E. coli] as the cause of diseases classified elsewhere: Secondary | ICD-10-CM | POA: Diagnosis not present

## 2020-12-24 DIAGNOSIS — G959 Disease of spinal cord, unspecified: Secondary | ICD-10-CM | POA: Diagnosis present

## 2020-12-24 DIAGNOSIS — R4689 Other symptoms and signs involving appearance and behavior: Secondary | ICD-10-CM | POA: Diagnosis not present

## 2020-12-24 DIAGNOSIS — S2243XA Multiple fractures of ribs, bilateral, initial encounter for closed fracture: Secondary | ICD-10-CM | POA: Diagnosis present

## 2020-12-24 DIAGNOSIS — D62 Acute posthemorrhagic anemia: Secondary | ICD-10-CM | POA: Diagnosis not present

## 2020-12-24 DIAGNOSIS — S066X9A Traumatic subarachnoid hemorrhage with loss of consciousness of unspecified duration, initial encounter: Secondary | ICD-10-CM | POA: Diagnosis present

## 2020-12-24 DIAGNOSIS — R011 Cardiac murmur, unspecified: Secondary | ICD-10-CM | POA: Diagnosis not present

## 2020-12-24 DIAGNOSIS — R000 Tachycardia, unspecified: Secondary | ICD-10-CM | POA: Diagnosis not present

## 2020-12-24 DIAGNOSIS — S069X3S Unspecified intracranial injury with loss of consciousness of 1 hour to 5 hours 59 minutes, sequela: Secondary | ICD-10-CM | POA: Diagnosis not present

## 2020-12-24 DIAGNOSIS — I7771 Dissection of carotid artery: Secondary | ICD-10-CM | POA: Diagnosis present

## 2020-12-24 DIAGNOSIS — Y92410 Unspecified street and highway as the place of occurrence of the external cause: Secondary | ICD-10-CM | POA: Diagnosis not present

## 2020-12-24 DIAGNOSIS — S15101A Unspecified injury of right vertebral artery, initial encounter: Secondary | ICD-10-CM | POA: Diagnosis present

## 2020-12-24 DIAGNOSIS — G379 Demyelinating disease of central nervous system, unspecified: Secondary | ICD-10-CM | POA: Diagnosis not present

## 2020-12-24 DIAGNOSIS — K5901 Slow transit constipation: Secondary | ICD-10-CM | POA: Diagnosis not present

## 2020-12-24 DIAGNOSIS — T07XXXA Unspecified multiple injuries, initial encounter: Secondary | ICD-10-CM | POA: Diagnosis not present

## 2020-12-24 DIAGNOSIS — S065X9A Traumatic subdural hemorrhage with loss of consciousness of unspecified duration, initial encounter: Secondary | ICD-10-CM | POA: Diagnosis present

## 2020-12-24 DIAGNOSIS — Z23 Encounter for immunization: Secondary | ICD-10-CM | POA: Diagnosis not present

## 2020-12-24 DIAGNOSIS — S52615A Nondisplaced fracture of left ulna styloid process, initial encounter for closed fracture: Secondary | ICD-10-CM | POA: Diagnosis present

## 2020-12-24 DIAGNOSIS — R454 Irritability and anger: Secondary | ICD-10-CM | POA: Diagnosis not present

## 2020-12-24 DIAGNOSIS — S270XXA Traumatic pneumothorax, initial encounter: Secondary | ICD-10-CM | POA: Diagnosis present

## 2020-12-24 DIAGNOSIS — R4189 Other symptoms and signs involving cognitive functions and awareness: Secondary | ICD-10-CM | POA: Diagnosis present

## 2020-12-24 DIAGNOSIS — F10220 Alcohol dependence with intoxication, uncomplicated: Secondary | ICD-10-CM | POA: Diagnosis present

## 2020-12-24 DIAGNOSIS — G9340 Encephalopathy, unspecified: Secondary | ICD-10-CM | POA: Diagnosis present

## 2020-12-24 DIAGNOSIS — S36115A Moderate laceration of liver, initial encounter: Secondary | ICD-10-CM | POA: Diagnosis present

## 2020-12-24 DIAGNOSIS — F10239 Alcohol dependence with withdrawal, unspecified: Secondary | ICD-10-CM | POA: Diagnosis not present

## 2020-12-24 DIAGNOSIS — E232 Diabetes insipidus: Secondary | ICD-10-CM | POA: Diagnosis present

## 2020-12-24 DIAGNOSIS — I7774 Dissection of vertebral artery: Secondary | ICD-10-CM | POA: Diagnosis present

## 2020-12-24 DIAGNOSIS — R609 Edema, unspecified: Secondary | ICD-10-CM | POA: Diagnosis present

## 2020-12-24 DIAGNOSIS — Z20822 Contact with and (suspected) exposure to covid-19: Secondary | ICD-10-CM | POA: Diagnosis present

## 2020-12-24 DIAGNOSIS — N390 Urinary tract infection, site not specified: Secondary | ICD-10-CM | POA: Diagnosis present

## 2020-12-24 DIAGNOSIS — K5900 Constipation, unspecified: Secondary | ICD-10-CM | POA: Diagnosis not present

## 2020-12-24 DIAGNOSIS — S069X0S Unspecified intracranial injury without loss of consciousness, sequela: Secondary | ICD-10-CM | POA: Diagnosis not present

## 2020-12-24 DIAGNOSIS — G909 Disorder of the autonomic nervous system, unspecified: Secondary | ICD-10-CM | POA: Diagnosis present

## 2020-12-24 DIAGNOSIS — Y907 Blood alcohol level of 200-239 mg/100 ml: Secondary | ICD-10-CM | POA: Diagnosis present

## 2020-12-24 DIAGNOSIS — I808 Phlebitis and thrombophlebitis of other sites: Secondary | ICD-10-CM | POA: Diagnosis present

## 2020-12-24 DIAGNOSIS — S0101XA Laceration without foreign body of scalp, initial encounter: Secondary | ICD-10-CM | POA: Diagnosis present

## 2020-12-24 MED ORDER — SODIUM CHLORIDE 0.9 % IV SOLN
INTRAVENOUS | Status: DC | PRN
  Administered 2020-12-24: 500 mL via INTRAVENOUS
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
## 2020-12-24 NOTE — Progress Notes (Signed)
Pt is experiencing intermittent confusion as well as having trouble finishing complete sentences that make sense. Pt has been re-oriented several times to use her call bell when she needs assistance. Pt got out of bed on her own. Pt was directed back to her bed safely. Bed alarms are on but the patient is very forgetful. When re-oriented pt is apologetic.

## 2020-12-24 NOTE — Progress Notes (Signed)
Pt refuses to keep Wrist Brace on. Pt has been re-oriented and educated on the importance. RN has reapplied brace three times in the last hour.

## 2020-12-25 ENCOUNTER — Encounter (HOSPITAL_COMMUNITY): Payer: Self-pay

## 2020-12-25 ENCOUNTER — Other Ambulatory Visit: Payer: Self-pay

## 2020-12-25 LAB — CBC WITH DIFFERENTIAL/PLATELET
Abs Immature Granulocytes: 0.17 10*3/uL — ABNORMAL HIGH (ref 0.00–0.07)
Basophils Absolute: 0.1 10*3/uL (ref 0.0–0.1)
Basophils Relative: 0 %
Eosinophils Absolute: 0.2 10*3/uL (ref 0.0–0.5)
Eosinophils Relative: 2 %
HCT: 25.9 % — ABNORMAL LOW (ref 36.0–46.0)
Hemoglobin: 8.7 g/dL — ABNORMAL LOW (ref 12.0–15.0)
Immature Granulocytes: 1 %
Lymphocytes Relative: 15 %
Lymphs Abs: 1.8 10*3/uL (ref 0.7–4.0)
MCH: 33.1 pg (ref 26.0–34.0)
MCHC: 33.6 g/dL (ref 30.0–36.0)
MCV: 98.5 fL (ref 80.0–100.0)
Monocytes Absolute: 1 10*3/uL (ref 0.1–1.0)
Monocytes Relative: 9 %
Neutro Abs: 9 10*3/uL — ABNORMAL HIGH (ref 1.7–7.7)
Neutrophils Relative %: 73 %
Platelets: 222 10*3/uL (ref 150–400)
RBC: 2.63 MIL/uL — ABNORMAL LOW (ref 3.87–5.11)
RDW: 12.3 % (ref 11.5–15.5)
WBC: 12.2 10*3/uL — ABNORMAL HIGH (ref 4.0–10.5)
nRBC: 0 % (ref 0.0–0.2)

## 2020-12-25 LAB — BASIC METABOLIC PANEL
Anion gap: 10 (ref 5–15)
BUN: <5 mg/dL — ABNORMAL LOW (ref 6–20)
CO2: 21 mmol/L — ABNORMAL LOW (ref 22–32)
Calcium: 8 mg/dL — ABNORMAL LOW (ref 8.9–10.3)
Chloride: 109 mmol/L (ref 98–111)
Creatinine, Ser: 0.51 mg/dL (ref 0.44–1.00)
GFR, Estimated: >60 mL/min (ref >60)
Glucose, Bld: 94 mg/dL (ref 70–99)
Potassium: 3.9 mmol/L (ref 3.5–5.1)
Sodium: 140 mmol/L (ref 135–145)

## 2020-12-25 MED ORDER — PNEUMOCOCCAL VAC POLYVALENT 25 MCG/0.5ML IJ INJ
0.5000 mL | INJECTION | INTRAMUSCULAR | Status: AC
  Administered 2020-12-26: 0.5 mL via INTRAMUSCULAR
  Filled 2020-12-25: qty 0.5

## 2020-12-25 MED ORDER — INFLUENZA VAC SPLIT QUAD 0.5 ML IM SUSY
0.5000 mL | PREFILLED_SYRINGE | INTRAMUSCULAR | Status: AC
  Administered 2020-12-26: 0.5 mL via INTRAMUSCULAR
  Filled 2020-12-25: qty 0.5

## 2020-12-25 NOTE — Progress Notes (Signed)
Pt yelled out "I need to go pee". RN entered the room and patient was laying there with her eyes closed. RN stood at the corner of the room, pt then woke up, pressed the call bell to ask to go to the bathroom. Pt seems to be able to comprehend how and when she needs to use the call bell.
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
## 2020-12-25 NOTE — Progress Notes (Signed)
Pt removed Wrist Brace and RN re-oriented and educated that she needed to keep the brace on. Pt was able to put the Brace on her left arm without assistance.

## 2020-12-25 NOTE — Plan of Care (Signed)
  Problem: Clinical Measurements: Goal: Ability to maintain clinical measurements within normal limits will improve Outcome: Progressing Goal: Will remain free from infection Outcome: Progressing Goal: Diagnostic test results will improve Outcome: Progressing Goal: Respiratory complications will improve Outcome: Progressing Goal: Cardiovascular complication will be avoided Outcome: Progressing   Problem: Activity: Goal: Risk for activity intolerance will decrease Outcome: Progressing   Problem: Nutrition: Goal: Adequate nutrition will be maintained Outcome: Progressing   Problem: Elimination: Goal: Will not experience complications related to bowel motility Outcome: Progressing Goal: Will not experience complications related to urinary retention Outcome: Progressing   Problem: Safety: Goal: Ability to remain free from injury will improve Outcome: Progressing   Problem: Skin Integrity: Goal: Risk for impaired skin integrity will decrease Outcome: Progressing   Problem: Education: Goal: Knowledge of General Education information will improve Description: Including pain rating scale, medication(s)/side effects and non-pharmacologic comfort measures Outcome: Not Progressing   Problem: Health Behavior/Discharge Planning: Goal: Ability to manage health-related needs will improve Outcome: Not Progressing   Problem: Coping: Goal: Level of anxiety will decrease Outcome: Not Progressing   Problem: Pain Managment: Goal: General experience of comfort will improve Outcome: Not Progressing

## 2020-12-25 NOTE — Progress Notes (Signed)
Pt. Has expressive aphasia(almost a word salad at times). Pt's mother educated to not argue with patient over her words as she may be using the wrong word. Pt complains of intermittent severe headache, relieved with dilaudid prn. Pt is diaphoretic but no other overt symptoms of withdrawal today,continue to monitor. I have not given patient po meds today as she coughed when taking sips of water. Pt has declined mouth care and food today.            Pt remains a high fall risk. She does not use call bell and is extremely impulsive with toileting. pts mother is at bedside today ,floor mats in place, bed alarm on,call bell in reach, frequent verbal contacts with patient.  Melodye Ped

## 2020-12-25 NOTE — Progress Notes (Signed)
While RN was assisting care to another pt, this patient got up out of bed to go to the bathroom, vomited on the floor and fell. Pt stated she "hit her head". Pt apologized for getting out of bed without calling. RN called Freida Busman, MD and made her aware. Will continue q1hr neuro checks and monitor for any neuro changes. Pt was assisted to back to her bed, bed alarms are on, fall mats placed.

## 2020-12-25 NOTE — Progress Notes (Signed)
RN was in the room with patient and let her know I would be back with Pain Meds. RN was alerted back to the room as the bed alarm was going off. Pt was found standing up as another RN was holding her from falling. Pt stated "I did not think I needed to ask to get up". Pt was re-oriented and educated on use of the call bell. Pt was placed back in bed safely. Bed alarm on.

## 2020-12-26 DIAGNOSIS — S069X1A Unspecified intracranial injury with loss of consciousness of 30 minutes or less, initial encounter: Secondary | ICD-10-CM

## 2020-12-26 MED ORDER — POTASSIUM CHLORIDE IN NACL 20-0.9 MEQ/L-% IV SOLN
INTRAVENOUS | Status: DC
  Filled 2020-12-26 (×4): qty 1000

## 2020-12-26 MED ORDER — LORAZEPAM 2 MG/ML IJ SOLN
1.0000 mg | INTRAMUSCULAR | Status: DC | PRN
  Administered 2020-12-26 – 2020-12-28 (×6): 1 mg via INTRAVENOUS
  Filled 2020-12-26 (×6): qty 1

## 2020-12-26 MED ORDER — CHLORHEXIDINE GLUCONATE CLOTH 2 % EX PADS
6.0000 | MEDICATED_PAD | Freq: Every day | CUTANEOUS | Status: DC
  Administered 2020-12-26 – 2021-01-01 (×7): 6 via TOPICAL

## 2020-12-26 MED ORDER — SODIUM CHLORIDE 0.9 % IV SOLN: INTRAVENOUS | Status: DC
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
## 2020-12-26 NOTE — Progress Notes (Signed)
Inpatient Rehab Admissions Coordinator Note:   Per PT/OT/ST recommendations, pt was screened for CIR candidacy by Wolfgang Phoenix, MS, CCC-SLp.  At this time we are recommending an inpatient rehab consult. AC will place consult order per protocol.  Please contact me with questions.    Wolfgang Phoenix, MS, CCC-SLP Admissions Coordinator [phone] 12/26/20 10:55 AM
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
## 2020-12-27 LAB — CBC
HCT: 25.5 % — ABNORMAL LOW (ref 36.0–46.0)
Hemoglobin: 8.6 g/dL — ABNORMAL LOW (ref 12.0–15.0)
MCH: 32 pg (ref 26.0–34.0)
MCHC: 33.7 g/dL (ref 30.0–36.0)
MCV: 94.8 fL (ref 80.0–100.0)
Platelets: 251 10*3/uL (ref 150–400)
RBC: 2.69 MIL/uL — ABNORMAL LOW (ref 3.87–5.11)
RDW: 12.2 % (ref 11.5–15.5)
WBC: 10.2 10*3/uL (ref 4.0–10.5)
nRBC: 0 % (ref 0.0–0.2)

## 2020-12-27 LAB — BASIC METABOLIC PANEL
Anion gap: 11 (ref 5–15)
BUN: 12 mg/dL (ref 6–20)
CO2: 20 mmol/L — ABNORMAL LOW (ref 22–32)
Calcium: 8.3 mg/dL — ABNORMAL LOW (ref 8.9–10.3)
Chloride: 99 mmol/L (ref 98–111)
Creatinine, Ser: 0.44 mg/dL (ref 0.44–1.00)
GFR, Estimated: >60 mL/min (ref >60)
Glucose, Bld: 84 mg/dL (ref 70–99)
Potassium: 3.7 mmol/L (ref 3.5–5.1)
Sodium: 130 mmol/L — ABNORMAL LOW (ref 135–145)

## 2020-12-27 MED ORDER — METHOCARBAMOL 500 MG PO TABS
750.0000 mg | ORAL_TABLET | Freq: Four times a day (QID) | ORAL | Status: DC
  Administered 2020-12-27 (×4): 750 mg via ORAL
  Filled 2020-12-27 (×5): qty 2

## 2020-12-27 MED ORDER — HYDROMORPHONE HCL 1 MG/ML IJ SOLN
0.5000 mg | Freq: Four times a day (QID) | INTRAMUSCULAR | Status: DC | PRN
  Administered 2020-12-27 – 2020-12-28 (×2): 0.5 mg via INTRAVENOUS
  Filled 2020-12-27 (×2): qty 0.5
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
## 2020-12-27 NOTE — TOC Progression Note (Signed)
Transition of Care [facility]) - Progression Note    Patient Details  Name: Denise [name] MRN: [id number] Date of Birth: 1996-04-03  [facility] CM/SW Contact  Glennon Mac, RN Phone Number: 12/27/2020, 3:51 PM  Clinical Narrative:  Pt's mother not at bedside currently.  Left requested letter with pt's nurse, Denise [name], to give to her when she returns this afternoon.       Expected Discharge Plan: IP Rehab Facility Barriers to Discharge: Continued Medical Work up  Expected Discharge Plan and Services Expected Discharge Plan: IP Rehab Facility   Discharge Planning Services: CM Consult   Living arrangements for the past 2 months: Apartment                                       Social Determinants of Health (SDOH) Interventions    Readmission Risk Interventions No flowsheet data found.  Quintella Baton, RN, BSN  Trauma/Neuro ICU Case Manager [phone]

## 2020-12-27 NOTE — Plan of Care (Signed)
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
## 2020-12-28 ENCOUNTER — Inpatient Hospital Stay (HOSPITAL_COMMUNITY): Payer: 59

## 2020-12-28 MED ORDER — SENNOSIDES-DOCUSATE SODIUM 8.6-50 MG PO TABS
1.0000 | ORAL_TABLET | Freq: Two times a day (BID) | ORAL | Status: DC
  Administered 2020-12-28 – 2021-01-01 (×9): 1 via ORAL
  Filled 2020-12-28 (×9): qty 1

## 2020-12-28 MED ORDER — LIDOCAINE 5 % EX PTCH
1.0000 | MEDICATED_PATCH | CUTANEOUS | Status: DC
  Administered 2020-12-28 – 2021-01-01 (×5): 1 via TRANSDERMAL
  Filled 2020-12-28 (×6): qty 1

## 2020-12-28 MED ORDER — POLYETHYLENE GLYCOL 3350 17 G PO PACK: 17.0000 g | PACK | Freq: Two times a day (BID) | ORAL | Status: DC | PRN

## 2020-12-28 MED ORDER — ENSURE ENLIVE PO LIQD
237.0000 mL | Freq: Three times a day (TID) | ORAL | Status: DC
  Administered 2020-12-28 – 2021-01-01 (×11): 237 mL via ORAL
  Filled 2020-12-28: qty 237

## 2020-12-28 MED ORDER — CLONAZEPAM 0.5 MG PO TBDP
0.5000 mg | ORAL_TABLET | Freq: Two times a day (BID) | ORAL | Status: DC
  Administered 2020-12-28 (×2): 0.5 mg via ORAL
  Filled 2020-12-28 (×2): qty 1

## 2020-12-28 MED ORDER — QUETIAPINE FUMARATE 25 MG PO TABS: 25.0000 mg | ORAL_TABLET | Freq: Two times a day (BID) | ORAL | Status: DC

## 2020-12-28 MED ORDER — LORAZEPAM 2 MG/ML IJ SOLN
1.0000 mg | INTRAMUSCULAR | Status: DC | PRN
  Administered 2020-12-29 – 2020-12-31 (×6): 1 mg via INTRAVENOUS
  Filled 2020-12-28 (×8): qty 1

## 2020-12-28 MED ORDER — METHOCARBAMOL 500 MG PO TABS
1000.0000 mg | ORAL_TABLET | Freq: Four times a day (QID) | ORAL | Status: DC
Start: 2020-12-28 — End: 2021-01-01
  Administered 2020-12-28 – 2021-01-01 (×17): 1000 mg via ORAL
  Filled 2020-12-28 (×16): qty 2

## 2020-12-28 MED ORDER — HYDROMORPHONE HCL 1 MG/ML IJ SOLN
0.5000 mg | Freq: Two times a day (BID) | INTRAMUSCULAR | Status: DC | PRN
  Administered 2020-12-28 – 2020-12-30 (×3): 0.5 mg via INTRAVENOUS
  Filled 2020-12-28 (×4): qty 0.5

## 2020-12-28 MED ORDER — LORAZEPAM 0.5 MG PO TABS
0.5000 mg | ORAL_TABLET | ORAL | Status: DC | PRN
  Administered 2020-12-28: 0.5 mg via ORAL
  Filled 2020-12-28 (×2): qty 1

## 2020-12-28 NOTE — Progress Notes (Signed)
Pt transferred to and from MRI. VS stable. Returned to the room 2H05. Mother at the bedside. Bed in low position, alarms are on, call bell in reach, continue to monitor
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
## 2020-12-28 NOTE — Progress Notes (Signed)
OT Cancellation Note  Patient Details Name: Denise [name] MRN: [id number] DOB: Jun 26, 1996   Cancelled Treatment:    Reason Eval/Treat Not Completed: Patient's level of consciousness.  Patient given Ativan, and resting.  OT to continue efforts as appropriate.   Chanita Boden D Josede Cicero 12/28/2020, 2:16 PM
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
## 2020-12-28 NOTE — Progress Notes (Signed)
Inpatient Rehabilitation Admissions Coordinator  I met at bedside with patient and her Mom. We discussed goals and expectations of an inpt rehab admit. Also discussed ways to decrease patient agitation with interactions concerning phone and apartment issues. I will begin insurance authorization with [place] for a possible CIR admit pending insurance approval.  Danne Baxter, RN, MSN Rehab Admissions Coordinator [phone] 12/28/2020 1:44 PM

## 2020-12-28 NOTE — Progress Notes (Signed)
SLP Cancellation Note  Patient Details Name: Denise [name] MRN: [id number] DOB: July 22, 1996   Cancelled treatment:       Reason Eval/Treat Not Completed: Patient at procedure or test/unavailable   Mahala Menghini., M.A. CCC-SLP Acute Rehabilitation Services Pager [phone] Office [phone]  12/28/2020, 4:21 PM

## 2020-12-29 ENCOUNTER — Inpatient Hospital Stay (HOSPITAL_COMMUNITY): Payer: 59

## 2020-12-29 DIAGNOSIS — G9340 Encephalopathy, unspecified: Secondary | ICD-10-CM

## 2020-12-29 LAB — URINALYSIS, ROUTINE W REFLEX MICROSCOPIC
Bilirubin Urine: NEGATIVE
Glucose, UA: NEGATIVE mg/dL
Ketones, ur: NEGATIVE mg/dL
Leukocytes,Ua: NEGATIVE
Nitrite: NEGATIVE
Protein, ur: NEGATIVE mg/dL
Specific Gravity, Urine: 1.002 — ABNORMAL LOW (ref 1.005–1.030)
pH: 6 (ref 5.0–8.0)

## 2020-12-29 LAB — CBC
HCT: 28.7 % — ABNORMAL LOW (ref 36.0–46.0)
Hemoglobin: 9.9 g/dL — ABNORMAL LOW (ref 12.0–15.0)
MCH: 32.7 pg (ref 26.0–34.0)
MCHC: 34.5 g/dL (ref 30.0–36.0)
MCV: 94.7 fL (ref 80.0–100.0)
Platelets: 379 10*3/uL (ref 150–400)
RBC: 3.03 MIL/uL — ABNORMAL LOW (ref 3.87–5.11)
RDW: 12.9 % (ref 11.5–15.5)
WBC: 13.3 10*3/uL — ABNORMAL HIGH (ref 4.0–10.5)
nRBC: 0 % (ref 0.0–0.2)

## 2020-12-29 LAB — BASIC METABOLIC PANEL
Anion gap: 17 — ABNORMAL HIGH (ref 5–15)
BUN: 7 mg/dL (ref 6–20)
CO2: 19 mmol/L — ABNORMAL LOW (ref 22–32)
Calcium: 9.2 mg/dL (ref 8.9–10.3)
Chloride: 102 mmol/L (ref 98–111)
Creatinine, Ser: 0.65 mg/dL (ref 0.44–1.00)
GFR, Estimated: >60 mL/min (ref >60)
Glucose, Bld: 136 mg/dL — ABNORMAL HIGH (ref 70–99)
Potassium: 3.6 mmol/L (ref 3.5–5.1)
Sodium: 138 mmol/L (ref 135–145)

## 2020-12-29 MED ORDER — PROSOURCE PLUS PO LIQD
30.0000 mL | Freq: Two times a day (BID) | ORAL | Status: DC
  Administered 2020-12-29 – 2021-01-01 (×6): 30 mL via ORAL
  Filled 2020-12-29 (×7): qty 30

## 2020-12-29 MED ORDER — LORAZEPAM 2 MG/ML IJ SOLN
2.0000 mg | INTRAMUSCULAR | Status: AC | PRN
  Administered 2020-12-29: 2 mg via INTRAVENOUS
  Filled 2020-12-29: qty 1

## 2020-12-29 MED ORDER — HALOPERIDOL LACTATE 5 MG/ML IJ SOLN: 5.0000 mg | Freq: Once | INTRAMUSCULAR | Status: DC

## 2020-12-29 MED ORDER — CLONAZEPAM 0.5 MG PO TBDP
0.5000 mg | ORAL_TABLET | Freq: Three times a day (TID) | ORAL | Status: DC
Start: 2020-12-29 — End: 2020-12-29

## 2020-12-29 MED ORDER — LORAZEPAM 1 MG PO TABS
1.0000 mg | ORAL_TABLET | Freq: Three times a day (TID) | ORAL | Status: DC
  Administered 2020-12-29 – 2021-01-01 (×9): 1 mg via ORAL
  Filled 2020-12-29 (×10): qty 1

## 2020-12-29 MED ORDER — LORAZEPAM 0.5 MG PO TABS
0.5000 mg | ORAL_TABLET | Freq: Three times a day (TID) | ORAL | Status: DC
Start: 2020-12-29 — End: 2020-12-29

## 2020-12-29 NOTE — Progress Notes (Signed)
This patient was extremely agitated upon arrival to MRI. During the 1st minute of scanning the patient began pulling herself out of the machine. Exam was terminated. As scan table was taken out of the room the patient sat straight up and tried to get off the table. Patient was calmed down and put back into her bed to go back to the room. Unable to achieve any MRI scanning in this condition. Dr. Derry Lory aware.

## 2020-12-29 NOTE — Progress Notes (Signed)
OT Cancellation Note  Patient Details Name: Denise [name] MRN: [id number] DOB: Apr 23, 1996   Cancelled Treatment:    Reason Eval/Treat Not Completed: Medical issues which prohibited therapy.  HR 167, RN requesting to hold.  OT to continue efforts as appropriate.   Caeson Filippi D Jeffre Enriques 12/29/2020, 10:44 AM

## 2020-12-29 NOTE — Progress Notes (Signed)
° °  CSW received consult for asssitance regarding short term disability,housing,and  medical insurance. CSW tried to call patients mother Lanora Manis. No asnwer. Will try to call again in the am.  CSW will continue to follow.
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
## 2020-12-29 NOTE — Progress Notes (Signed)
Calorie Count Note  48 hour calorie count ordered.  Diet: Regular Supplements: Ensure Enlive TID  Pt emotional this AM. Finally had BM today after being constipated with no BM since admission Noted plan for CIR  Breakfast: 100% french toast and 2 slices of bacon, 50% of coffee and grape juice, 100% 2% milk (570 kcals, 15 g of protein) Lunch: 30% egg salad sandwich on wheat, 5% banana pudding, 100% baked chips (300 kcals, 5 g of protein),  Dinner: 2 bites mashed potatoes, 50% baked chips, 3-4 chicken nuggets and 50% fries from [employer] (300 kcals, 18 g protein) Supplements: 50% Ensure (175 kcals, 10 g protein), 10% Ensure (35 kcals, 2 g protein)  Total intake: 1380 kcal (75% of minimum estimated needs)  50 protein (50% of minimum estimated needs)  Interventions:   Encourage po intake Ensure Enlive po TID, each supplement provides 350 kcal and 20 grams of protein  Family may bring in food from home such as [employer] to promote po intake  Add 30 ml ProSource Plus BID, each supplement provides 100 kcals and 15 grams protein, given low protein intake   Romelle Starcher MS, RDN, LDN, CNSC Registered Dietitian III Clinical Nutrition RD Pager and On-Call Pager Number Located in [place]

## 2020-12-29 NOTE — Progress Notes (Signed)
PT Cancellation Note  Patient Details Name: Denise [name] MRN: [id number] DOB: 02/23/1996   Cancelled Treatment:    Reason Eval/Treat Not Completed: Medical issues which prohibited therapy (HR 167 bpm.  Pt needs MRI but delayed due to issues when pt went to get MRI. Will check back tomorrow.)   Berline Lopes 12/29/2020, 10:44 AM Candance Bohlman W,PT Acute Rehabilitation Services Pager:  [phone]  Office:  [phone]
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
## 2020-12-29 NOTE — Progress Notes (Signed)
Inpatient Rehabilitation Admissions Coordinator  I have [facility] approval for CIR admit. I await medical workup completion and bed availability to admit. I will follow her progress daily.  Ottie Glazier, RN, MSN Rehab Admissions Coordinator [phone] 12/29/2020 1:08 PM
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
## 2020-12-30 ENCOUNTER — Encounter (HOSPITAL_COMMUNITY): Payer: Self-pay

## 2020-12-30 DIAGNOSIS — S065X9A Traumatic subdural hemorrhage with loss of consciousness of unspecified duration, initial encounter: Secondary | ICD-10-CM | POA: Diagnosis not present

## 2020-12-30 DIAGNOSIS — G379 Demyelinating disease of central nervous system, unspecified: Secondary | ICD-10-CM

## 2020-12-30 DIAGNOSIS — R000 Tachycardia, unspecified: Secondary | ICD-10-CM

## 2020-12-30 DIAGNOSIS — R011 Cardiac murmur, unspecified: Secondary | ICD-10-CM

## 2020-12-30 LAB — BASIC METABOLIC PANEL
Anion gap: 11 (ref 5–15)
BUN: 7 mg/dL (ref 6–20)
CO2: 27 mmol/L (ref 22–32)
Calcium: 9.2 mg/dL (ref 8.9–10.3)
Chloride: 102 mmol/L (ref 98–111)
Creatinine, Ser: 0.63 mg/dL (ref 0.44–1.00)
GFR, Estimated: >60 mL/min (ref >60)
Glucose, Bld: 115 mg/dL — ABNORMAL HIGH (ref 70–99)
Potassium: 3.8 mmol/L (ref 3.5–5.1)
Sodium: 140 mmol/L (ref 135–145)

## 2020-12-30 LAB — OSMOLALITY, URINE: Osmolality, Ur: 263 mOsm/kg — ABNORMAL LOW (ref 300–900)

## 2020-12-30 LAB — TSH: TSH: 1.02 u[IU]/mL (ref 0.350–4.500)

## 2020-12-30 LAB — CBC
HCT: 28.1 % — ABNORMAL LOW (ref 36.0–46.0)
Hemoglobin: 9.1 g/dL — ABNORMAL LOW (ref 12.0–15.0)
MCH: 31.7 pg (ref 26.0–34.0)
MCHC: 32.4 g/dL (ref 30.0–36.0)
MCV: 97.9 fL (ref 80.0–100.0)
Platelets: 464 10*3/uL — ABNORMAL HIGH (ref 150–400)
RBC: 2.87 MIL/uL — ABNORMAL LOW (ref 3.87–5.11)
RDW: 13.2 % (ref 11.5–15.5)
WBC: 16.7 10*3/uL — ABNORMAL HIGH (ref 4.0–10.5)
nRBC: 0 % (ref 0.0–0.2)

## 2020-12-30 LAB — HEMOGLOBIN A1C
Hgb A1c MFr Bld: 4.7 % — ABNORMAL LOW (ref 4.8–5.6)
Mean Plasma Glucose: 88.19 mg/dL

## 2020-12-30 LAB — SODIUM, URINE, RANDOM: Sodium, Ur: 47 mmol/L

## 2020-12-30 LAB — MAGNESIUM: Magnesium: 2.2 mg/dL (ref 1.7–2.4)

## 2020-12-30 LAB — NEUROMYELITIS OPTICA AUTOAB, IGG: NMO-IgG: <1.5 U/mL (ref 0.0–3.0)

## 2020-12-30 MED ORDER — SODIUM CHLORIDE 0.9 % IV BOLUS
1000.0000 mL | Freq: Once | INTRAVENOUS | Status: AC
  Administered 2020-12-30: 1000 mL via INTRAVENOUS

## 2020-12-30 MED ORDER — METOPROLOL TARTRATE 12.5 MG HALF TABLET: 12.5000 mg | ORAL_TABLET | Freq: Two times a day (BID) | ORAL | Status: DC

## 2020-12-30 MED ORDER — IBUPROFEN 200 MG PO TABS
600.0000 mg | ORAL_TABLET | Freq: Once | ORAL | Status: AC
  Administered 2020-12-30: 600 mg via ORAL
  Filled 2020-12-30: qty 3

## 2020-12-30 MED ORDER — METOPROLOL TARTRATE 25 MG PO TABS
25.0000 mg | ORAL_TABLET | Freq: Two times a day (BID) | ORAL | Status: DC
  Administered 2020-12-30 – 2021-01-01 (×5): 25 mg via ORAL
  Filled 2020-12-30 (×5): qty 1

## 2020-12-30 MED ORDER — SODIUM CHLORIDE 0.9 % IV SOLN
1.0000 g | INTRAVENOUS | Status: DC
  Administered 2020-12-30: 1 g via INTRAVENOUS
  Filled 2020-12-30 (×2): qty 10

## 2020-12-30 MED ORDER — SODIUM CHLORIDE 0.9 % IV SOLN: INTRAVENOUS | Status: DC

## 2020-12-30 MED ORDER — METOPROLOL TARTRATE 5 MG/5ML IV SOLN
5.0000 mg | Freq: Four times a day (QID) | INTRAVENOUS | Status: DC | PRN
  Filled 2020-12-30: qty 5

## 2020-12-30 NOTE — Plan of Care (Signed)
  Problem: Clinical Measurements: Goal: Cardiovascular complication will be avoided Outcome: Progressing   Problem: Activity: Goal: Risk for activity intolerance will decrease Outcome: Progressing   Problem: Pain Managment: Goal: General experience of comfort will improve Outcome: Progressing   Problem: Safety: Goal: Ability to remain free from injury will improve Outcome: Progressing   
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
## 2020-12-30 NOTE — Progress Notes (Addendum)
OT Cancellation Note  Patient Details Name: Denise [name] MRN: [id number] DOB: 23-Dec-1996   Cancelled Treatment:    Reason Eval/Treat Not Completed: Medical issues which prohibited therapy. Hold per RN reporting patient with elevated HR in 160's at rest. OT will check back as time allows.   Addendum:1128 Patient continues to be inappropriate for therapy at this time. OT will continue to follow acutely for progress toward POC.    Kallie Edward OTR/L Supplemental OT, Department of rehab services [phone]  Travon Crochet R H. 12/30/2020, 8:03 AM

## 2020-12-30 NOTE — Progress Notes (Addendum)
Spoke with patients nurse. HR has improved after IV metoprolol (currently in the 90's). I also ordered a fluid bolus. I did contact cardiology given recurrent tachycardia into the 150's +.  Neurology contacted me as patient was unfortunately unable to tolerate MRI yesterday. They are recommended outpatient follow-up with Neurology in 2-4 weeks after discharge (with Dr. Epimenio Foot at [facility]). No further inpatient workup indicated at this point per Neurology.  Patient now with fever of 100.9. I ordered blood cultures earlier this morning. CXR and UA done yesterday. UCx pending. I spoke with the nurse to see if we could get patients blood, urine osm and urine na that I ordered this morning collected.

## 2020-12-30 NOTE — Progress Notes (Signed)
Patient confuse, agitated, very impulsive and tries to get out of bed without assistant. Heart rate increases when patient is agitated and getting out of bed.

## 2020-12-30 NOTE — Progress Notes (Signed)
PT Cancellation Note  Patient Details Name: Chyanna Flock MRN: [id number] DOB: 07-27-1996   Cancelled Treatment:    Reason Eval/Treat Not Completed: Medical issues which prohibited therapy RN cleared therapy initially earlier this afternoon as pt was resting with HR in 90s bpm. RN requesting pt maintain HR < 100 bpm during session. However, upon arrival later HR was resting in 110s bpm thus holding off on PT this date, notified RN. Tightened cervical brace and educated sitter on loosening it slightly in ~1 hour to relieve pressure areas for ~5 min and then re-tighten. Will follow-up another day as able.  [name], PT, DPT Acute Rehabilitation Services  Pager: [phone] Office: [phone]   [name] 12/30/2020, 4:29 PM
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
## 2020-12-30 NOTE — Progress Notes (Signed)
PT Cancellation Note  Patient Details Name: Denise [name] MRN: [id number] DOB: [birth year]   Cancelled Treatment:    Reason Eval/Treat Not Completed: Medical issues which prohibited therapy (Per RN, pt's HR in 160s bpm resting in bed and requests hold on therapy at this time. Will follow-up as able.)  [name], PT, DPT Acute Rehabilitation Services  Pager: [phone] Office: [phone]   [name] 12/30/2020, 8:03 AM

## 2020-12-30 NOTE — Progress Notes (Signed)
At 0915 CIWA was 12 and she was agitated and climbing out of bed and when nurse went to give IV Ativan, site was symptomatic.  She said site was painful so nurse removed IV and  IV was not able to be replaced until 1000 and at that time she was CIWA of 1 and remains at 1 at this time.  She is resting and calm and quiet, and even snoring intermittently so Ativan was wasted with 2 nurses and she will continue to be monitored.

## 2020-12-30 NOTE — Progress Notes (Signed)
Calorie Count Note  48 hour calorie count ordered.  Diet: Regular Supplements: Ensure Enlive TID, 41ml Prosource Plus BID   Day 1: Breakfast: 570 kcals, 15 g of protein Lunch: 300 kcals, 5 g of protein Dinner: 300 kcals, 18 g protein Supplements: 210 kcals, 12 g protein  Total intake: 1380 kcal (75% of minimum estimated needs)  50 g protein (50% of minimum estimated needs)  Day 2:  Breakfast: 124 kcals, 3 g protein Lunch: 372 kcals, 12 g protein Dinner: 336 kcals, 9 g protein Supplements: 1150 kcals, 54 g protein (3 Ensures & 36ml Prosource Plus)  Total intake: 1982 kcal (100% of minimum estimated needs)  78 g protein (78% of minimum estimated needs)  Interventions:   D/c calorie count  Magic cup TID with meals, each supplement provides 290 kcal and 9 grams of protein  Continue to encourage po intake  Continue Ensure Enlive po TID, each supplement provides 350 kcal and 20 grams of protein  Family may continue to bring in food from home such as [employer] to promote po intake  Continue 30 ml ProSource Plus BID, each supplement provides 100 kcals and 15 grams protein, given low protein intake   Eugene Gavia, MS, RD, LDN RD pager number and weekend/on-call pager number located in [place].
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
## 2020-12-31 ENCOUNTER — Inpatient Hospital Stay (HOSPITAL_COMMUNITY): Payer: 59

## 2020-12-31 DIAGNOSIS — R011 Cardiac murmur, unspecified: Secondary | ICD-10-CM

## 2020-12-31 LAB — BASIC METABOLIC PANEL
Anion gap: 14 (ref 5–15)
BUN: 8 mg/dL (ref 6–20)
CO2: 23 mmol/L (ref 22–32)
Calcium: 8.5 mg/dL — ABNORMAL LOW (ref 8.9–10.3)
Chloride: 106 mmol/L (ref 98–111)
Creatinine, Ser: 0.56 mg/dL (ref 0.44–1.00)
GFR, Estimated: >60 mL/min (ref >60)
Glucose, Bld: 163 mg/dL — ABNORMAL HIGH (ref 70–99)
Potassium: 3.8 mmol/L (ref 3.5–5.1)
Sodium: 143 mmol/L (ref 135–145)

## 2020-12-31 LAB — CBC WITH DIFFERENTIAL/PLATELET
Abs Immature Granulocytes: 0.29 10*3/uL — ABNORMAL HIGH (ref 0.00–0.07)
Basophils Absolute: 0.1 10*3/uL (ref 0.0–0.1)
Basophils Relative: 0 %
Eosinophils Absolute: 0.2 10*3/uL (ref 0.0–0.5)
Eosinophils Relative: 1 %
HCT: 22.9 % — ABNORMAL LOW (ref 36.0–46.0)
Hemoglobin: 7.7 g/dL — ABNORMAL LOW (ref 12.0–15.0)
Immature Granulocytes: 2 %
Lymphocytes Relative: 16 %
Lymphs Abs: 2.4 10*3/uL (ref 0.7–4.0)
MCH: 32.9 pg (ref 26.0–34.0)
MCHC: 33.6 g/dL (ref 30.0–36.0)
MCV: 97.9 fL (ref 80.0–100.0)
Monocytes Absolute: 0.9 10*3/uL (ref 0.1–1.0)
Monocytes Relative: 6 %
Neutro Abs: 11.2 10*3/uL — ABNORMAL HIGH (ref 1.7–7.7)
Neutrophils Relative %: 75 %
Platelets: 448 10*3/uL — ABNORMAL HIGH (ref 150–400)
RBC: 2.34 MIL/uL — ABNORMAL LOW (ref 3.87–5.11)
RDW: 13 % (ref 11.5–15.5)
WBC: 15 10*3/uL — ABNORMAL HIGH (ref 4.0–10.5)
nRBC: 0 % (ref 0.0–0.2)

## 2020-12-31 LAB — URINE CULTURE: Culture: 80000 — AB

## 2020-12-31 LAB — ECHOCARDIOGRAM COMPLETE
AR max vel: 2.25 cm2
AV Area VTI: 2.26 cm2
AV Area mean vel: 2.16 cm2
AV Mean grad: 5 mmHg
AV Peak grad: 7.8 mmHg
Ao pk vel: 1.4 m/s
Height: 62 in
S' Lateral: 2.3 cm
Weight: 1700.19 oz

## 2020-12-31 MED ORDER — CEFAZOLIN SODIUM-DEXTROSE 1-4 GM/50ML-% IV SOLN
1.0000 g | Freq: Three times a day (TID) | INTRAVENOUS | Status: DC
  Administered 2020-12-31 – 2021-01-01 (×4): 1 g via INTRAVENOUS
  Filled 2020-12-31 (×5): qty 50

## 2020-12-31 MED ORDER — DESMOPRESSIN ACETATE 0.1 MG PO TABS
0.0500 mg | ORAL_TABLET | Freq: Two times a day (BID) | ORAL | Status: DC
Start: 2020-12-31 — End: 2021-01-01
  Administered 2020-12-31 – 2021-01-01 (×3): 0.05 mg via ORAL
  Filled 2020-12-31 (×4): qty 1

## 2020-12-31 NOTE — Plan of Care (Signed)
  Problem: Education: Goal: Knowledge of General Education information will improve Description: Including pain rating scale, medication(s)/side effects and non-pharmacologic comfort measures Outcome: Progressing   Problem: Health Behavior/Discharge Planning: Goal: Ability to manage health-related needs will improve Outcome: Progressing   Problem: Clinical Measurements: Goal: Diagnostic test results will improve Outcome: Progressing Goal: Respiratory complications will improve Outcome: Progressing Goal: Cardiovascular complication will be avoided Outcome: Progressing   Problem: Activity: Goal: Risk for activity intolerance will decrease Outcome: Progressing   Problem: Nutrition: Goal: Adequate nutrition will be maintained Outcome: Progressing   Problem: Elimination: Goal: Will not experience complications related to bowel motility Outcome: Progressing Goal: Will not experience complications related to urinary retention Outcome: Progressing   Problem: Safety: Goal: Ability to remain free from injury will improve Outcome: Progressing   Problem: Skin Integrity: Goal: Risk for impaired skin integrity will decrease Outcome: Progressing
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
## 2020-12-31 NOTE — Progress Notes (Signed)
PT Cancellation Note  Patient Details Name: Denise [name] MRN: [id number] DOB: February 20, 1996   Cancelled Treatment:    Reason Eval/Treat Not Completed: Patient at procedure or test/unavailable. PT attempted x2 today, pt resting initially, and receiving echo upon second attempt. PT will continue to follow and return as able.   Rolm Baptise, PT, DPT   Acute Rehabilitation Department Pager #: [phone]   Gaetana Michaelis 12/31/2020, 4:16 PM

## 2020-12-31 NOTE — Progress Notes (Signed)
Spent over an hour trying to wet head and soften with conditioner, detangler, mild baby shampoo to get blood and tangles out of severely matted hair.  Was very gentle, as doing so causes more bleeding at her site of dehiscence.  Pressure was applied and bleeding stopped.  In matted hair, found a quarter sized area where skin was removed to right side of head in hairline and also found a slightly larger area on the left side of her hair just behind her lac area.  I did pass on to nurse Ty, to pass along.  Those areas were also pressurized and dressings applied to cover them.  Her entire head is so matted that after an hour, we had to quit because we were getting no where.  She has been impulsive throughout the day and hops up quickly to go to the bedside commode and gets tangled up in the tubing and starts to cry then gets angry.  She does this often.  She does have a sitter at her bedside.

## 2020-12-31 NOTE — Progress Notes (Signed)
Urine culture came back with pan sens e.coli. Ok to change ceftriaxone to cefazolin to complete a total of 5d per [employer], [place].  Ulyses Southward, PharmD, BCIDP, AAHIVP, CPP Infectious Disease Pharmacist 12/31/2020 8:34 AM

## 2020-12-31 NOTE — Progress Notes (Signed)
Patient is still impulsive and restless, Mother at bedside, but going home tonight to get rest. Head bandage is still clean, dry and intact. Informed night shift RN Alan Ripper of dressing change and that there is still a lot of matted hair that makes it difficult to visualize, clean or measure wounds. Also informed Night shift of the two smaller wounds.

## 2020-12-31 NOTE — Progress Notes (Signed)
  Echocardiogram 2D Echocardiogram has been performed.  Gerda Diss 12/31/2020, 4:39 PM
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
## 2021-01-01 ENCOUNTER — Encounter (HOSPITAL_COMMUNITY): Payer: Self-pay | Admitting: Physical Medicine & Rehabilitation

## 2021-01-01 ENCOUNTER — Inpatient Hospital Stay (HOSPITAL_COMMUNITY)
Admission: RE | Admit: 2021-01-01 | Discharge: 2021-01-17 | DRG: 945 | Disposition: A | Discharge status: Home / Self Care | Payer: 59 | Source: Intra-hospital | Attending: Physical Medicine & Rehabilitation | Admitting: Physical Medicine & Rehabilitation

## 2021-01-01 ENCOUNTER — Inpatient Hospital Stay (HOSPITAL_COMMUNITY): Payer: 59 | Admitting: Speech Pathology

## 2021-01-01 ENCOUNTER — Encounter (HOSPITAL_COMMUNITY): Payer: Self-pay | Admitting: Emergency Medicine

## 2021-01-01 ENCOUNTER — Other Ambulatory Visit: Payer: Self-pay

## 2021-01-01 ENCOUNTER — Inpatient Hospital Stay (HOSPITAL_COMMUNITY): Payer: 59

## 2021-01-01 DIAGNOSIS — S138XXD Sprain of joints and ligaments of other parts of neck, subsequent encounter: Secondary | ICD-10-CM

## 2021-01-01 DIAGNOSIS — S36115D Moderate laceration of liver, subsequent encounter: Secondary | ICD-10-CM

## 2021-01-01 DIAGNOSIS — G959 Disease of spinal cord, unspecified: Secondary | ICD-10-CM | POA: Diagnosis present

## 2021-01-01 DIAGNOSIS — R454 Irritability and anger: Secondary | ICD-10-CM | POA: Diagnosis not present

## 2021-01-01 DIAGNOSIS — S069X0A Unspecified intracranial injury without loss of consciousness, initial encounter: Secondary | ICD-10-CM

## 2021-01-01 DIAGNOSIS — E232 Diabetes insipidus: Secondary | ICD-10-CM | POA: Diagnosis present

## 2021-01-01 DIAGNOSIS — F1010 Alcohol abuse, uncomplicated: Secondary | ICD-10-CM | POA: Diagnosis present

## 2021-01-01 DIAGNOSIS — Z20822 Contact with and (suspected) exposure to covid-19: Secondary | ICD-10-CM | POA: Diagnosis not present

## 2021-01-01 DIAGNOSIS — Z91410 Personal history of adult physical and sexual abuse: Secondary | ICD-10-CM | POA: Diagnosis not present

## 2021-01-01 DIAGNOSIS — N390 Urinary tract infection, site not specified: Secondary | ICD-10-CM | POA: Diagnosis present

## 2021-01-01 DIAGNOSIS — R000 Tachycardia, unspecified: Secondary | ICD-10-CM | POA: Diagnosis present

## 2021-01-01 DIAGNOSIS — S52612D Displaced fracture of left ulna styloid process, subsequent encounter for closed fracture with routine healing: Secondary | ICD-10-CM

## 2021-01-01 DIAGNOSIS — K5901 Slow transit constipation: Secondary | ICD-10-CM | POA: Diagnosis not present

## 2021-01-01 DIAGNOSIS — S069XAA Unspecified intracranial injury with loss of consciousness status unknown, initial encounter: Secondary | ICD-10-CM | POA: Diagnosis present

## 2021-01-01 DIAGNOSIS — F319 Bipolar disorder, unspecified: Secondary | ICD-10-CM | POA: Diagnosis present

## 2021-01-01 DIAGNOSIS — M7989 Other specified soft tissue disorders: Secondary | ICD-10-CM | POA: Diagnosis not present

## 2021-01-01 DIAGNOSIS — S066X9D Traumatic subarachnoid hemorrhage with loss of consciousness of unspecified duration, subsequent encounter: Principal | ICD-10-CM

## 2021-01-01 DIAGNOSIS — I808 Phlebitis and thrombophlebitis of other sites: Secondary | ICD-10-CM | POA: Diagnosis present

## 2021-01-01 DIAGNOSIS — R4586 Emotional lability: Secondary | ICD-10-CM | POA: Diagnosis not present

## 2021-01-01 DIAGNOSIS — R609 Edema, unspecified: Secondary | ICD-10-CM | POA: Diagnosis not present

## 2021-01-01 DIAGNOSIS — S069X9A Unspecified intracranial injury with loss of consciousness of unspecified duration, initial encounter: Secondary | ICD-10-CM | POA: Diagnosis present

## 2021-01-01 DIAGNOSIS — K5900 Constipation, unspecified: Secondary | ICD-10-CM | POA: Diagnosis not present

## 2021-01-01 DIAGNOSIS — S0101XD Laceration without foreign body of scalp, subsequent encounter: Secondary | ICD-10-CM

## 2021-01-01 DIAGNOSIS — Z7141 Alcohol abuse counseling and surveillance of alcoholic: Secondary | ICD-10-CM

## 2021-01-01 DIAGNOSIS — S069X2A Unspecified intracranial injury with loss of consciousness of 31 minutes to 59 minutes, initial encounter: Secondary | ICD-10-CM

## 2021-01-01 DIAGNOSIS — S2243XD Multiple fractures of ribs, bilateral, subsequent encounter for fracture with routine healing: Secondary | ICD-10-CM | POA: Diagnosis not present

## 2021-01-01 DIAGNOSIS — B9620 Unspecified Escherichia coli [E. coli] as the cause of diseases classified elsewhere: Secondary | ICD-10-CM | POA: Diagnosis present

## 2021-01-01 DIAGNOSIS — I7774 Dissection of vertebral artery: Secondary | ICD-10-CM | POA: Diagnosis present

## 2021-01-01 DIAGNOSIS — R4587 Impulsiveness: Secondary | ICD-10-CM | POA: Diagnosis present

## 2021-01-01 DIAGNOSIS — T07XXXA Unspecified multiple injuries, initial encounter: Secondary | ICD-10-CM | POA: Diagnosis not present

## 2021-01-01 DIAGNOSIS — R4689 Other symptoms and signs involving appearance and behavior: Secondary | ICD-10-CM | POA: Diagnosis not present

## 2021-01-01 DIAGNOSIS — G35 Multiple sclerosis: Secondary | ICD-10-CM

## 2021-01-01 DIAGNOSIS — S069X3S Unspecified intracranial injury with loss of consciousness of 1 hour to 5 hours 59 minutes, sequela: Secondary | ICD-10-CM | POA: Diagnosis not present

## 2021-01-01 LAB — BASIC METABOLIC PANEL
Anion gap: 9 (ref 5–15)
BUN: 12 mg/dL (ref 6–20)
CO2: 25 mmol/L (ref 22–32)
Calcium: 8.6 mg/dL — ABNORMAL LOW (ref 8.9–10.3)
Chloride: 103 mmol/L (ref 98–111)
Creatinine, Ser: 0.5 mg/dL (ref 0.44–1.00)
GFR, Estimated: >60 mL/min (ref >60)
Glucose, Bld: 115 mg/dL — ABNORMAL HIGH (ref 70–99)
Potassium: 3.9 mmol/L (ref 3.5–5.1)
Sodium: 137 mmol/L (ref 135–145)

## 2021-01-01 LAB — CBC
HCT: 23 % — ABNORMAL LOW (ref 36.0–46.0)
Hemoglobin: 7.3 g/dL — ABNORMAL LOW (ref 12.0–15.0)
MCH: 31.6 pg (ref 26.0–34.0)
MCHC: 31.7 g/dL (ref 30.0–36.0)
MCV: 99.6 fL (ref 80.0–100.0)
Platelets: 452 10*3/uL — ABNORMAL HIGH (ref 150–400)
RBC: 2.31 MIL/uL — ABNORMAL LOW (ref 3.87–5.11)
RDW: 13.2 % (ref 11.5–15.5)
WBC: 19.8 10*3/uL — ABNORMAL HIGH (ref 4.0–10.5)
nRBC: 0.1 % (ref 0.0–0.2)

## 2021-01-01 MED ORDER — LORAZEPAM 1 MG PO TABS
1.0000 mg | ORAL_TABLET | Freq: Three times a day (TID) | ORAL | Status: DC
  Administered 2021-01-01 – 2021-01-17 (×48): 1 mg via ORAL
  Filled 2021-01-01 (×48): qty 1

## 2021-01-01 MED ORDER — METOPROLOL TARTRATE 25 MG PO TABS: 25.0000 mg | ORAL_TABLET | Freq: Two times a day (BID) | ORAL | Status: DC

## 2021-01-01 MED ORDER — LIDOCAINE 5 % EX PTCH: 1.0000 | MEDICATED_PATCH | CUTANEOUS | Status: DC

## 2021-01-01 MED ORDER — LORAZEPAM 1 MG PO TABS: 1.0000 mg | ORAL_TABLET | Freq: Three times a day (TID) | ORAL | 0 refills | Status: DC

## 2021-01-01 MED ORDER — THIAMINE HCL 100 MG PO TABS
100.0000 mg | ORAL_TABLET | Freq: Every day | ORAL | Status: DC
  Administered 2021-01-02 – 2021-01-04 (×3): 100 mg via ORAL
  Filled 2021-01-01 (×2): qty 1

## 2021-01-01 MED ORDER — CEFAZOLIN SODIUM-DEXTROSE 1-4 GM/50ML-% IV SOLN
1.0000 g | Freq: Three times a day (TID) | INTRAVENOUS | Status: AC
  Administered 2021-01-01 – 2021-01-02 (×4): 1 g via INTRAVENOUS
  Filled 2021-01-01 (×4): qty 50

## 2021-01-01 MED ORDER — DESMOPRESSIN ACETATE 0.1 MG PO TABS: 0.0500 mg | ORAL_TABLET | Freq: Two times a day (BID) | ORAL | Status: DC

## 2021-01-01 MED ORDER — ENSURE ENLIVE PO LIQD: 237.0000 mL | Freq: Three times a day (TID) | ORAL | 12 refills | Status: DC

## 2021-01-01 MED ORDER — ONDANSETRON 4 MG PO TBDP: 4.0000 mg | ORAL_TABLET | Freq: Four times a day (QID) | ORAL | 0 refills | Status: DC | PRN

## 2021-01-01 MED ORDER — TRAZODONE HCL 50 MG PO TABS
50.0000 mg | ORAL_TABLET | Freq: Every day | ORAL | Status: DC
  Administered 2021-01-02: 50 mg via ORAL
  Filled 2021-01-01 (×2): qty 1

## 2021-01-01 MED ORDER — SENNOSIDES-DOCUSATE SODIUM 8.6-50 MG PO TABS
1.0000 | ORAL_TABLET | Freq: Two times a day (BID) | ORAL | Status: DC
  Administered 2021-01-01 – 2021-01-02 (×2): 1 via ORAL
  Filled 2021-01-01 (×2): qty 1

## 2021-01-01 MED ORDER — ALUM & MAG HYDROXIDE-SIMETH 200-200-20 MG/5ML PO SUSP: 15.0000 mL | ORAL | Status: DC | PRN

## 2021-01-01 MED ORDER — PROPRANOLOL HCL 20 MG PO TABS
20.0000 mg | ORAL_TABLET | Freq: Three times a day (TID) | ORAL | Status: DC
  Administered 2021-01-01 – 2021-01-03 (×5): 20 mg via ORAL
  Filled 2021-01-01 (×6): qty 1

## 2021-01-01 MED ORDER — NORETHIN ACE-ETH ESTRAD-FE 1-20 MG-MCG PO TABS
1.0000 | ORAL_TABLET | Freq: Every day | ORAL | Status: DC
  Administered 2021-01-04: 1 via ORAL

## 2021-01-01 MED ORDER — TRAZODONE HCL 50 MG PO TABS: 50.0000 mg | ORAL_TABLET | Freq: Every evening | ORAL | Status: DC | PRN

## 2021-01-01 MED ORDER — PROSOURCE PLUS PO LIQD: 30.0000 mL | Freq: Two times a day (BID) | ORAL | Status: DC

## 2021-01-01 MED ORDER — THIAMINE HCL 100 MG PO TABS: 100.0000 mg | ORAL_TABLET | Freq: Every day | ORAL | Status: DC

## 2021-01-01 MED ORDER — LIDOCAINE 5 % EX PTCH
2.0000 | MEDICATED_PATCH | CUTANEOUS | Status: DC
  Administered 2021-01-01 – 2021-01-16 (×13): 2 via TRANSDERMAL
  Filled 2021-01-01 (×15): qty 2

## 2021-01-01 MED ORDER — ACETAMINOPHEN 500 MG PO TABS: 1000.0000 mg | ORAL_TABLET | Freq: Four times a day (QID) | ORAL | 0 refills | Status: DC

## 2021-01-01 MED ORDER — OXYCODONE HCL 5 MG PO TABS
5.0000 mg | ORAL_TABLET | ORAL | Status: DC | PRN
  Administered 2021-01-01 – 2021-01-02 (×2): 10 mg via ORAL
  Administered 2021-01-02: 5 mg via ORAL
  Administered 2021-01-02 – 2021-01-04 (×8): 10 mg via ORAL
  Administered 2021-01-04: 5 mg via ORAL
  Administered 2021-01-05 – 2021-01-11 (×9): 10 mg via ORAL
  Administered 2021-01-12: 5 mg via ORAL
  Administered 2021-01-17: 10 mg via ORAL
  Filled 2021-01-01 (×6): qty 2
  Filled 2021-01-01: qty 1
  Filled 2021-01-01: qty 2
  Filled 2021-01-01: qty 1
  Filled 2021-01-01: qty 2
  Filled 2021-01-01: qty 1
  Filled 2021-01-01: qty 2
  Filled 2021-01-01: qty 1
  Filled 2021-01-01 (×4): qty 2
  Filled 2021-01-01 (×2): qty 1
  Filled 2021-01-01 (×9): qty 2

## 2021-01-01 MED ORDER — ONDANSETRON 4 MG PO TBDP
4.0000 mg | ORAL_TABLET | Freq: Four times a day (QID) | ORAL | Status: DC | PRN
Start: 2021-01-01 — End: 2021-01-17
  Administered 2021-01-12: 4 mg via ORAL
  Filled 2021-01-01: qty 1

## 2021-01-01 MED ORDER — ASPIRIN 81 MG PO CHEW: 81.0000 mg | CHEWABLE_TABLET | Freq: Every day | ORAL | Status: DC

## 2021-01-01 MED ORDER — ONDANSETRON HCL 4 MG/2ML IJ SOLN: 4.0000 mg | Freq: Four times a day (QID) | INTRAMUSCULAR | Status: DC | PRN

## 2021-01-01 MED ORDER — ENOXAPARIN SODIUM 30 MG/0.3ML ~~LOC~~ SOLN: 30.0000 mg | Freq: Two times a day (BID) | SUBCUTANEOUS | Status: DC

## 2021-01-01 MED ORDER — SODIUM CHLORIDE 0.9 % IV SOLN: 10.0000 mL | INTRAVENOUS | 0 refills | Status: DC | PRN

## 2021-01-01 MED ORDER — DESMOPRESSIN ACETATE 0.1 MG PO TABS
0.0500 mg | ORAL_TABLET | Freq: Two times a day (BID) | ORAL | Status: DC
  Administered 2021-01-01 – 2021-01-06 (×11): 0.05 mg via ORAL
  Filled 2021-01-01 (×12): qty 1

## 2021-01-01 MED ORDER — ADULT MULTIVITAMIN W/MINERALS CH: 1.0000 | ORAL_TABLET | Freq: Every day | ORAL | Status: DC

## 2021-01-01 MED ORDER — METOPROLOL TARTRATE 5 MG/5ML IV SOLN: 5.0000 mg | Freq: Four times a day (QID) | INTRAVENOUS | Status: DC | PRN

## 2021-01-01 MED ORDER — SENNOSIDES-DOCUSATE SODIUM 8.6-50 MG PO TABS: 1.0000 | ORAL_TABLET | Freq: Two times a day (BID) | ORAL | Status: DC

## 2021-01-01 MED ORDER — ENSURE ENLIVE PO LIQD
237.0000 mL | Freq: Three times a day (TID) | ORAL | Status: DC
  Administered 2021-01-01 – 2021-01-17 (×42): 237 mL via ORAL
  Filled 2021-01-01 (×6): qty 237

## 2021-01-01 MED ORDER — CHLORHEXIDINE GLUCONATE 0.12% ORAL RINSE (MEDLINE KIT): 15.0000 mL | Freq: Two times a day (BID) | OROMUCOSAL | 0 refills | Status: DC

## 2021-01-01 MED ORDER — PROSOURCE PLUS PO LIQD
30.0000 mL | Freq: Two times a day (BID) | ORAL | Status: DC
  Administered 2021-01-01 – 2021-01-17 (×27): 30 mL via ORAL
  Filled 2021-01-01 (×29): qty 30

## 2021-01-01 MED ORDER — SODIUM CHLORIDE 0.9 % IV SOLN: 5.0000 mL | INTRAVENOUS | 0 refills | Status: DC

## 2021-01-01 MED ORDER — OXYCODONE HCL 5 MG PO TABS
5.0000 mg | ORAL_TABLET | ORAL | 0 refills | Status: DC | PRN
Start: 2021-01-01 — End: 2021-01-16

## 2021-01-01 MED ORDER — CHLORHEXIDINE GLUCONATE CLOTH 2 % EX PADS: 6.0000 | MEDICATED_PAD | Freq: Every day | CUTANEOUS | Status: DC

## 2021-01-01 MED ORDER — LORAZEPAM 2 MG/ML IJ SOLN: 1.0000 mg | INTRAMUSCULAR | 0 refills | Status: DC | PRN

## 2021-01-01 MED ORDER — POLYETHYLENE GLYCOL 3350 17 G PO PACK: 17.0000 g | PACK | Freq: Two times a day (BID) | ORAL | 0 refills | Status: DC | PRN

## 2021-01-01 MED ORDER — LIDOCAINE 5 % EX PTCH: 1.0000 | MEDICATED_PATCH | CUTANEOUS | 0 refills | Status: DC

## 2021-01-01 MED ORDER — ENOXAPARIN SODIUM 30 MG/0.3ML ~~LOC~~ SOLN
30.0000 mg | Freq: Two times a day (BID) | SUBCUTANEOUS | Status: DC
  Administered 2021-01-01: 30 mg via SUBCUTANEOUS
  Filled 2021-01-01: qty 0.3

## 2021-01-01 MED ORDER — METHOCARBAMOL 500 MG PO TABS
1000.0000 mg | ORAL_TABLET | Freq: Four times a day (QID) | ORAL | Status: DC
  Administered 2021-01-01 – 2021-01-17 (×63): 1000 mg via ORAL
  Filled 2021-01-01 (×63): qty 2

## 2021-01-01 MED ORDER — CEFAZOLIN SODIUM-DEXTROSE 1-4 GM/50ML-% IV SOLN: 1.0000 g | Freq: Three times a day (TID) | INTRAVENOUS | Status: DC

## 2021-01-01 MED ORDER — POLYETHYLENE GLYCOL 3350 17 G PO PACK
17.0000 g | PACK | Freq: Two times a day (BID) | ORAL | Status: DC | PRN
  Administered 2021-01-14: 17 g via ORAL
  Filled 2021-01-01: qty 1

## 2021-01-01 MED ORDER — ADULT MULTIVITAMIN W/MINERALS CH
1.0000 | ORAL_TABLET | Freq: Every day | ORAL | Status: DC
  Administered 2021-01-02 – 2021-01-17 (×16): 1 via ORAL
  Filled 2021-01-01 (×16): qty 1

## 2021-01-01 MED ORDER — METHOCARBAMOL 500 MG PO TABS: 1000.0000 mg | ORAL_TABLET | Freq: Four times a day (QID) | ORAL | Status: DC

## 2021-01-01 MED ORDER — MELATONIN 5 MG PO TABS
10.0000 mg | ORAL_TABLET | Freq: Every day | ORAL | Status: DC
  Administered 2021-01-01 – 2021-01-15 (×15): 10 mg via ORAL
  Filled 2021-01-01 (×15): qty 2

## 2021-01-01 MED ORDER — HYDROMORPHONE HCL 1 MG/ML IJ SOLN: 0.5000 mg | Freq: Two times a day (BID) | INTRAMUSCULAR | 0 refills | Status: DC | PRN
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
## 2021-01-01 NOTE — Progress Notes (Signed)
      Review of labs from earlier this afternoon:  Hgb 7.3  Down from 7.7 on 1/8; 9.1 on 1/7   Normal on admit WBC increased to 19.8 Will repeat CBC in AM   Hold ASA and Lovenox until resulted     Signed, Dietrich Pates, MD  01/01/2021, 11:12 PM
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
## 2021-01-01 NOTE — Progress Notes (Signed)
Ortho tech paged for cervical collar; claims materials does the cervical collar replacements. Materials paged and will call back.

## 2021-01-01 NOTE — Progress Notes (Signed)
Materials  called and they said that they dont do replacement anymore. They need to order a new one. RN will report to incoming nurse.

## 2021-01-01 NOTE — Plan of Care (Signed)
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
## 2021-01-01 NOTE — Progress Notes (Signed)
To rehab alert patient with ongoing IV;per wheelchair accompanied by NT and mother. Oriented to unit set up. Fall precaution bundle done.

## 2021-01-01 NOTE — Progress Notes (Signed)
Patient is calm at the moment ; mom left but coming back later on; very cooperative at this time.
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
## 2021-01-01 NOTE — Progress Notes (Signed)
PT Cancellation Note  Patient Details Name: Denise [name] MRN: [id number] DOB: August 02, 1996   Cancelled Treatment:    Reason Eval/Treat Not Completed: Patient declined, no reason specified. Pt sleeping upon arrival with pt's mother requesting PT later to allow pt to rest as she was up and restless throughout the night. Will follow-up as able.  [name], PT, DPT Acute Rehabilitation Services  Pager: [phone] Office: [phone]    [name] 01/01/2021, 8:03 AM

## 2021-01-01 NOTE — Progress Notes (Signed)
Called report to CIR for patient to go to room 13, PT currently working with patient, patient ready for discharge, Mother at bedside
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
## 2021-01-02 ENCOUNTER — Inpatient Hospital Stay (HOSPITAL_COMMUNITY): Payer: Self-pay | Admitting: Occupational Therapy

## 2021-01-02 ENCOUNTER — Inpatient Hospital Stay (HOSPITAL_COMMUNITY): Payer: Self-pay

## 2021-01-02 ENCOUNTER — Inpatient Hospital Stay (HOSPITAL_COMMUNITY): Payer: 59

## 2021-01-02 ENCOUNTER — Inpatient Hospital Stay (HOSPITAL_COMMUNITY): Payer: 59 | Admitting: Speech Pathology

## 2021-01-02 DIAGNOSIS — S069X3S Unspecified intracranial injury with loss of consciousness of 1 hour to 5 hours 59 minutes, sequela: Secondary | ICD-10-CM

## 2021-01-02 DIAGNOSIS — M7989 Other specified soft tissue disorders: Secondary | ICD-10-CM

## 2021-01-02 DIAGNOSIS — R609 Edema, unspecified: Secondary | ICD-10-CM | POA: Diagnosis not present

## 2021-01-02 LAB — CBC WITH DIFFERENTIAL/PLATELET
Abs Immature Granulocytes: 0 10*3/uL (ref 0.00–0.07)
Basophils Absolute: 0.4 10*3/uL — ABNORMAL HIGH (ref 0.0–0.1)
Basophils Relative: 2 %
Eosinophils Absolute: 1.6 10*3/uL — ABNORMAL HIGH (ref 0.0–0.5)
Eosinophils Relative: 8 %
HCT: 23.4 % — ABNORMAL LOW (ref 36.0–46.0)
Hemoglobin: 7.5 g/dL — ABNORMAL LOW (ref 12.0–15.0)
Lymphocytes Relative: 13 %
Lymphs Abs: 2.6 10*3/uL (ref 0.7–4.0)
MCH: 31.6 pg (ref 26.0–34.0)
MCHC: 32.1 g/dL (ref 30.0–36.0)
MCV: 98.7 fL (ref 80.0–100.0)
Monocytes Absolute: 0.4 10*3/uL (ref 0.1–1.0)
Monocytes Relative: 2 %
Neutro Abs: 15 10*3/uL — ABNORMAL HIGH (ref 1.7–7.7)
Neutrophils Relative %: 75 %
Platelets: 508 10*3/uL — ABNORMAL HIGH (ref 150–400)
RBC: 2.37 MIL/uL — ABNORMAL LOW (ref 3.87–5.11)
RDW: 13.2 % (ref 11.5–15.5)
WBC: 20 10*3/uL — ABNORMAL HIGH (ref 4.0–10.5)
nRBC: 0 /100 WBC
nRBC: 0.1 % (ref 0.0–0.2)

## 2021-01-02 LAB — COMPREHENSIVE METABOLIC PANEL
ALT: 89 U/L — ABNORMAL HIGH (ref 0–44)
AST: 53 U/L — ABNORMAL HIGH (ref 15–41)
Albumin: 2.8 g/dL — ABNORMAL LOW (ref 3.5–5.0)
Alkaline Phosphatase: 139 U/L — ABNORMAL HIGH (ref 38–126)
Anion gap: 12 (ref 5–15)
BUN: 10 mg/dL (ref 6–20)
CO2: 25 mmol/L (ref 22–32)
Calcium: 8.7 mg/dL — ABNORMAL LOW (ref 8.9–10.3)
Chloride: 97 mmol/L — ABNORMAL LOW (ref 98–111)
Creatinine, Ser: 0.55 mg/dL (ref 0.44–1.00)
GFR, Estimated: >60 mL/min (ref >60)
Glucose, Bld: 108 mg/dL — ABNORMAL HIGH (ref 70–99)
Potassium: 3.9 mmol/L (ref 3.5–5.1)
Sodium: 134 mmol/L — ABNORMAL LOW (ref 135–145)
Total Bilirubin: 0.1 mg/dL — ABNORMAL LOW (ref 0.3–1.2)
Total Protein: 6.6 g/dL (ref 6.5–8.1)

## 2021-01-02 MED ORDER — SENNA 8.6 MG PO TABS
2.0000 | ORAL_TABLET | Freq: Every day | ORAL | Status: DC
  Administered 2021-01-02 – 2021-01-15 (×13): 17.2 mg via ORAL
  Filled 2021-01-02 (×14): qty 2

## 2021-01-02 MED ORDER — DOCUSATE SODIUM 100 MG PO CAPS
100.0000 mg | ORAL_CAPSULE | Freq: Three times a day (TID) | ORAL | Status: DC
  Administered 2021-01-02 – 2021-01-17 (×45): 100 mg via ORAL
  Filled 2021-01-02 (×47): qty 1

## 2021-01-02 MED ORDER — ENOXAPARIN SODIUM 30 MG/0.3ML ~~LOC~~ SOLN
30.0000 mg | SUBCUTANEOUS | Status: DC
  Administered 2021-01-02 – 2021-01-05 (×4): 30 mg via SUBCUTANEOUS
  Filled 2021-01-02 (×4): qty 0.3

## 2021-01-02 MED ORDER — ASPIRIN EC 81 MG PO TBEC
81.0000 mg | DELAYED_RELEASE_TABLET | Freq: Every day | ORAL | Status: AC
  Administered 2021-01-02 – 2021-01-10 (×9): 81 mg via ORAL
  Filled 2021-01-02 (×8): qty 1
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
## 2021-01-02 NOTE — Progress Notes (Signed)
Chaplain responded to consult for AD. Chaplain took AD to patient's room and spoke with patient's mother. Patient is suffering from severe brain trauma from [facility] and patient's mother does not know if she is capable of signing. Chaplain suggested she speak with SW or CW or patient's physician. Chaplain spoke with Director of Therapy and she will notify SW.  01/02/21 1100  Clinical Encounter Type  Visited With Patient and family together  Visit Type Initial  Referral From Nurse  Consult/Referral To Chaplain  Spiritual Encounters  Spiritual Needs Other (Comment)  Stress Factors  Patient Stress Factors Health changes  Family Stress Factors Health changes

## 2021-01-02 NOTE — Progress Notes (Signed)
Inpatient Rehabilitation  Patient information reviewed and entered into eRehab system by Yichen Gilardi M. Aniko Finnigan, M.A., CCC/SLP, PPS Coordinator.  Information including medical coding, functional ability and quality indicators will be reviewed and updated through discharge.    
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
## 2021-01-02 NOTE — Progress Notes (Signed)
Lower extremity venous has been completed.   Preliminary results in CV Proc.   Blanch Media 01/02/2021 2:46 PM

## 2021-01-02 NOTE — Progress Notes (Addendum)
Inpatient Rehabilitation Medication Review by a Pharmacist  A complete drug regimen review was completed for this patient to identify any potential clinically significant medication issues.  Clinically significant medication issues were identified:  yes   Type of Medication Issue Identified Description of Issue Urgent (address now) Non-Urgent (address on AM team rounds) Plan   Drug Interaction(s) (clinically significant)       Duplicate Therapy       Allergy       No Medication Administration End Date  -Desmopressin to be continued until decreased urine output due to DI per surgery - UOP not well documented on CIR -Cefazolin for UTI started 12/31/20 Non-urgent -Monitor mLs of urine output per day in order to determine when DI symptoms have improved and can stop desmopressin -Consider 3 day course (End 01/02/21) given uncomplicated UTI    Incorrect Dose       Additional Drug Therapy Needed  -Aspirin 81mg  x3 months recommended by [facility] for 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 (end date 01/10/21) -Enoxaparin for DVT prophylaxis (cleared by surgery 12/23/20)  Urgent Resume aspirin until 01/10/21 and enoxaparin   Other  PRN IV hydromorphone, lorazepam, metoprolol and Miralax recommended to be resumed by surgery on Discharge summary Non-urgent Resume only if needed      Name of provider notified for urgent issues identified: 01/12/21  Provider Method of Notification: Secure chat    For non-urgent medication issues to be resolved on team rounds tomorrow morning a [facility] Secure Chat Handoff was sent to:     Time spent performing this drug regimen review (minutes):  20  Deatra Ina, PharmD, [place], [facility] Clinical Pharmacist  Please check AMION for all [facility] phone numbers After 10:00 PM, call Main Pharmacy [phone]
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
## 2021-01-02 NOTE — Progress Notes (Signed)
Inpatient Rehabilitation Care Coordinator Assessment and Plan Patient Details  Name: Denise [name] MRN: [id number] Date of Birth: 1996/03/08  Today's Date: 01/02/2021  Hospital Problems: Principal Problem:   TBI (traumatic brain injury) [facility])  Past Medical History:  Past Medical History:  Diagnosis Date  . ADHD (attention deficit hyperactivity disorder)   . Bipolar disorder with depression [facility])    Past Surgical History:  Past Surgical History:  Procedure Laterality Date  . MYRINGOTOMY    . WISDOM TOOTH EXTRACTION  2014   Social History:  reports that she has never smoked. She has never used smokeless tobacco. She reports current alcohol use. She reports current drug use. Drug: Marijuana.  Family / Support Systems Marital Status: Single Spouse/Significant Other: Dating- (has boyfriend) Children: N/A Other Supports: mother and grandmother Anticipated Caregiver: Mother and grandmother Ability/Limitations of Caregiver: mother and grandmother Caregiver Availability: 24/7 Family Dynamics: Pt has been living alone and working FT.  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 Education: high school Read: Yes Write: Yes Employment Status: Employed Name of Employer: [employer] of Employment: 3 (years) Return to Work Plans: Would like to return to work as soon as possible Public relations account executive Issues: Denies Guardian/Conservator: N/A   Abuse/Neglect Abuse/Neglect Assessment Can Be Completed: Yes Physical Abuse: Denies Verbal Abuse: Denies Sexual Abuse: Denies Exploitation of patient/patient's resources: Denies Self-Neglect: Denies  Emotional Status Pt's affect, behavior and adjustment status: Pt in good spirits at time of visit. Recent Psychosocial Issues: Pt admits to struggling with depression/anxiety and has a therapist. Pt confirms medication is prescribed  by PCP Psychiatric History: See above Substance Abuse History: Pt admits since admission she has not vaped, and has not had the desire  Patient / Family Perceptions, Expectations & Goals Pt/Family understanding of illness & functional limitations: Pt has general understanding of care needs Premorbid pt/family roles/activities: Independent Anticipated changes in roles/activities/participation: Assistance with ADLs/iADLs Pt/family expectations/goals: Pt goal is to become strong enough to get self back to work  [employer]: None Premorbid Home Care/DME Agencies: None Transportation available at discharge: family Resource referrals recommended: Neuropsychology  Discharge Planning Living Arrangements: Parent,Other relatives Support Systems: Parent,Other relatives,Friends/neighbors Type of Residence: Data processing manager residence Administrator, sports: Multimedia programmer (specify) ([place]) Financial Resources: [place] Referred: No Living Expenses: Education officer, community Management: Patient Does the patient have any problems obtaining your medications?: No Home Management: Pt addressed all homecare needs Patient/Family Preliminary Plans: pt to have assistance Care Coordinator Anticipated Follow Up Needs: HH/OP  Clinical Impression SW met with pt in room to introduce self, explain role, and discuss discharge process. Pt has no HCPOA. Pt would prefer mother to [place] to be HCPOA. No DME. Pt aware SW to speak with her mother to discuss discharge needs.  Budd Freiermuth A Coraline Talwar 01/02/2021, 9:30 PM
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
## 2021-01-03 ENCOUNTER — Inpatient Hospital Stay (HOSPITAL_COMMUNITY): Payer: 59

## 2021-01-03 ENCOUNTER — Inpatient Hospital Stay (HOSPITAL_COMMUNITY): Payer: 59 | Admitting: Speech Pathology

## 2021-01-03 ENCOUNTER — Inpatient Hospital Stay (HOSPITAL_COMMUNITY): Payer: 59 | Admitting: Occupational Therapy

## 2021-01-03 DIAGNOSIS — R000 Tachycardia, unspecified: Secondary | ICD-10-CM

## 2021-01-03 LAB — CBC WITH DIFFERENTIAL/PLATELET
Abs Immature Granulocytes: 0.71 10*3/uL — ABNORMAL HIGH (ref 0.00–0.07)
Basophils Absolute: 0.1 10*3/uL (ref 0.0–0.1)
Basophils Relative: 0 %
Eosinophils Absolute: 0.3 10*3/uL (ref 0.0–0.5)
Eosinophils Relative: 2 %
HCT: 24.7 % — ABNORMAL LOW (ref 36.0–46.0)
Hemoglobin: 7.8 g/dL — ABNORMAL LOW (ref 12.0–15.0)
Immature Granulocytes: 4 %
Lymphocytes Relative: 14 %
Lymphs Abs: 2.5 10*3/uL (ref 0.7–4.0)
MCH: 31.5 pg (ref 26.0–34.0)
MCHC: 31.6 g/dL (ref 30.0–36.0)
MCV: 99.6 fL (ref 80.0–100.0)
Monocytes Absolute: 1.4 10*3/uL — ABNORMAL HIGH (ref 0.1–1.0)
Monocytes Relative: 8 %
Neutro Abs: 12.8 10*3/uL — ABNORMAL HIGH (ref 1.7–7.7)
Neutrophils Relative %: 72 %
Platelets: 601 10*3/uL — ABNORMAL HIGH (ref 150–400)
RBC: 2.48 MIL/uL — ABNORMAL LOW (ref 3.87–5.11)
RDW: 13.2 % (ref 11.5–15.5)
WBC: 17.8 10*3/uL — ABNORMAL HIGH (ref 4.0–10.5)
nRBC: 0 % (ref 0.0–0.2)

## 2021-01-03 LAB — GLUCOSE, CAPILLARY: Glucose-Capillary: 153 mg/dL — ABNORMAL HIGH (ref 70–99)

## 2021-01-03 MED ORDER — PROPRANOLOL HCL 10 MG PO TABS
30.0000 mg | ORAL_TABLET | Freq: Three times a day (TID) | ORAL | Status: DC
Start: 2021-01-03 — End: 2021-01-17
  Administered 2021-01-03 – 2021-01-17 (×39): 30 mg via ORAL
  Filled 2021-01-03 (×43): qty 1

## 2021-01-03 MED ORDER — TRAZODONE HCL 50 MG PO TABS
100.0000 mg | ORAL_TABLET | Freq: Every day | ORAL | Status: DC
  Administered 2021-01-03 – 2021-01-16 (×14): 100 mg via ORAL
  Filled 2021-01-03 (×14): qty 2

## 2021-01-03 NOTE — Progress Notes (Signed)
Patient ID: Denise [name], female   DOB: May 12, 1996, 25 y.o.   MRN: [id number]  SW met with pt in room to provide updates from team conference, and d/c date 1/21. Pt reported her mother would return as she had a medical appointment. SW to follow-up with pt mother to discuss d/c details.   *SW spoke with pt mother Benjamine Mola to provide updates on above, and inform on outpatient therapies recommended. SW to send referral to [facility]. Pt mother is concerned about pt mental health and would like for pt to have counseling follow-up. SW scheduled appointment with [facility] for Dr. Darol Destine [phone]) -on Wednesday, February 2 at [place] will need to fax over order, and requested physician put in referral.   Loralee Pacas, MSW, [place] Office: [phone] Cell: [phone] Fax: [phone]

## 2021-01-03 NOTE — Progress Notes (Signed)
Occupational Therapy Session Note  Patient Details  Name: Denise [name] MRN: [id number] Date of Birth: 07-Apr-1996  Today's Date: 01/03/2021 OT Individual Time: 0915-1000 OT Individual Time Calculation (min): 45 min    Short Term Goals: Week 1:  OT Short Term Goal 1 (Week 1): Pt will be able to don a shirt over cervical collar with supervision. OT Short Term Goal 2 (Week 1): Pt will be able to don underwear and pants with CGA. OT Short Term Goal 3 (Week 1): Pt will be able to complete toileting with CGA. OT Short Term Goal 4 (Week 1): Pt will demonstrate improved attention span by completing grooming with min cues.  Skilled Therapeutic Interventions/Progress Updates:    Pt greeted semi-reclined in bed and agreeable to OT treatment session. Pt declined to shower today stating she al most passed out yesterday in shower, but agreeable to washing up some at the sink. Pt ambulated to wc with CGA. Pt wanted to wash her face and clean out the blood in her ear. Pt was able to wash face with set-up, but began taking of c-collar requiring verbal cues that collar has to be on at all times. Min A to help pt get large chunks of blood out of L ear using q-tips pt's mother brought from home. Pt anxious when seeing her face in the mirror, but able to work through her emotions with encouragement from OT. Pt brushed teeth seated in wc at the sink with set-up. Pt reported her L wrist splint bothered her where an old IV site was. OT covered old IV site with foam dressing and pt reported much more comfort when wearing splint. Pt declined further ambulation in the hallway. Pt reported she enjoys coloring, so OT brought pt coloring page and worked on fine motor coordination and discussed therapeutic ways to control anxiety. Pt left semi-reclined in bed coloring with bed alarm on, call bell in reach, and needs met.   Therapy Documentation Precautions:  Precautions Precautions: Fall,Cervical Precaution Comments: C  collar Required Braces or Orthoses: Cervical Brace,Splint/Cast Cervical Brace: Hard collar,At all times Splint/Cast: L wrist support Restrictions Weight Bearing Restrictions: Yes LUE Weight Bearing: Weight bearing as tolerated Pain: Pain Assessment Pain Scale: 0-10 Pain Score: 4   Ribs, rest and repositioned for pain management   Therapy/Group: Individual Therapy  Valma Cava 01/03/2021, 9:44 AM

## 2021-01-03 NOTE — Progress Notes (Signed)
Pt having difficulty sleeping. Pt denies being in pain, but appears to be uncomfortable. PRN medications given but not effective.provider will be made aware.

## 2021-01-03 NOTE — Plan of Care (Signed)
Behavioral Plan   Rancho Level: VI-emerging VII  Behavior to decrease/ eliminate:   -impulsivity -perseveration on future plans, etc  Changes to environment:   -Decrease clutter in room, especially floor space -potentially sign off patient's mom to take her to the bathroom (will need to stay in bathroom with her)   Interventions:  -call bell within reach -bed alarm -telesitter -sleep/wake chart -MD is addressing sleep medications   Recommendations for interactions with patient:  -Introduce yourself and what you're going to do before you do it -remind patient before leaving room to use call bell for assistance   Attendees:   Feliberto Gottron, SLP Malachi Pro, PT Kearney Hard, OT

## 2021-01-03 NOTE — Progress Notes (Signed)
CBG recorded at 1147 1/11 was not completed on this patient. Error in arm band scanning. This patient's CBG was not checked at that time.
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
## 2021-01-03 NOTE — Progress Notes (Signed)
Pt notified staff, of her being in 10/10 pain. Pt c/o Rib pain and L wrist pain. Pt states she has been in pain for a while. This nurse assessed the pt pain level every 1-2 hours through out the night, and pt denied pain, so no PRNs were administered. This nurse educated the pt on the importance of notifying staff when she is in pain so interventions can  Be implemented.  Pt sitting up in bed, talking on the phone with her mother at this time. Call light in reach.

## 2021-01-03 NOTE — Progress Notes (Signed)
NT Diamond notified this nurse that the pts dressing on her head was off and bleeding. This nurse cleaned incision and left the dressing off per request of the pt. The pt also took the Lwrist splint off. Pt was educated on the importance of keeping the incision covered and splint on. Pt requested to refuse. Pt sitting up in bed at this time, eating breakfast. Denies pain. No signs of distress noted

## 2021-01-03 NOTE — Progress Notes (Signed)
Pts mother arrived to the unit. Soon as the mother entered the room, the pt and mother began to raise their voices at each other. The pt told her mother that staff talked to her inappropriately, made her wait 30+ mins for bathroom assistance and beverages. This nurse, Marcille Blanco NT, and Hoehne LPN informed the pts mother the pts information was not accurate.  Staff assisted the pt multiple times through out the night with bathroom, beverages and making sure all needs were met. Charge RN will be made aware of situation.

## 2021-01-03 NOTE — Patient Care Conference (Signed)
Inpatient RehabilitationTeam Conference and Plan of Care Update Date: 01/03/2021   Time: 10:41 AM    Patient Name: Denise [name]      Medical Record Number: [id number]  Date of Birth: January 11, 1996 Sex: Female         Room/Bed: 4W13C/4W13C-01 Payor Info: Payor: [employer]  / Plan: [employer] / Product Type: *No Product type* /    Admit Date/Time:  01/01/2021  2:29 PM  Primary Diagnosis:  TBI (traumatic brain injury) [facility])  Hospital Problems: Principal Problem:   TBI (traumatic brain injury) [facility])    Expected Discharge Date: Expected Discharge Date: 01/13/21  Team Members Present: Physician leading conference: Dr. Faith Rogue Care Coodinator Present: Cecile Sheerer, LCSWA;Maeghan Canny Marlyne Beards, RN, BSN, CRRN Nurse Present: Adam Phenix, LPN PT Present: Malachi Pro, PT OT Present: Kearney Hard, OT SLP Present: Feliberto Gottron, SLP PPS Coordinator present : Fae Pippin, SLP     Current Status/Progress Goal Weekly Team Focus  Bowel/Bladder     continent B/B   remain continent   treat UTI  Swallow/Nutrition/ Hydration             ADL's   min - mod self care; CGA transfers  supervision overall  attention, relaxed breathing, decreasing anxiety, balance, endurance, ADL and cognitive training, pt/family education   Mobility   supervision bed mobility, balance, transfers, minA x300' without AD  supervision  balance, multitasking, cognition, ambulation   Communication             Safety/Cognition/ Behavioral Observations  Mod A  Supervision  complex problem solving, recall, selective attention, awareness   Pain     complaints of pain 8/10   4/10 pain goal  assess pain q 4 hr and prn   Skin     laceration to forehead   no new skin breakdown   assess skin q shift and prn    Discharge Planning:      Team Discussion: Mother is always at the bedside, has been asked not to argue with the patient. Patient has a UTI and is being treated appropriately. Continuously working on  wound on the forehead to get it cleaned. Patient is impulsive and unsafe.  Patient on target to meet rehab goals: Patient is anxious and wants to wash her hair, MD says that should be fine. Patient walked without an AD and was an occasional contact guard. SLP reports she is doing well cognitively.    *See Care Plan and progress notes for long and short-term goals.   Revisions to Treatment Plan:  Sleep chart Teaching Needs: Family education, wound care, dressing changes, weight bearing precautions, medication management, etc.  Current Barriers to Discharge: Decreased caregiver support, Home enviroment access/layout, Wound care, Lack of/limited family support, Weight bearing restrictions, Medication compliance and Behavior  Possible Resolutions to Barriers: Continue current medications, provide emotional support to patient and family.     Medical Summary Current Status: severe traumatic brain injury, emotional lability, pain control. 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 rx behavior, sleep, rx uti   Continued Need for Acute Rehabilitation Level of Care: The patient requires daily medical management by a physician with specialized training in physical medicine and rehabilitation for the following reasons: Direction of a multidisciplinary physical rehabilitation program to maximize functional independence : Yes Medical management of patient stability for increased activity during participation in an intensive rehabilitation regime.: Yes Analysis of  laboratory values and/or radiology reports with any subsequent need for medication adjustment and/or medical intervention. : Yes   I attest that I was present, lead the team conference, and concur with the assessment and plan of the team.   Tennis Must 01/03/2021, 5:23 PM

## 2021-01-03 NOTE — Progress Notes (Signed)
Speech Language Pathology Daily Session Note  Patient Details  Name: Denise [name] MRN: [id number] Date of Birth: June 07, 1996  Today's Date: 01/03/2021 SLP Individual Time: 6333-5456 SLP Individual Time Calculation (min): 55 min  Short Term Goals: Week 1: SLP Short Term Goal 1 (Week 1): Patient will demonstrate functional problem solving for mildly complex tasks with Min verbal cues. SLP Short Term Goal 2 (Week 1): Patient will recall new, daily information with Min verbal cues. SLP Short Term Goal 3 (Week 1): Patient will demonstrate selective attention in a mildly distracting enviornment for ~45 minutes with Min verbal cues for redirection. SLP Short Term Goal 4 (Week 1): Patient will self-monitor and correct errors during functional tasks with Min verbal cues.  Skilled Therapeutic Interventions: Skilled treatment session focused on cognitive goals. SLP facilitated session by providing supervision-Min A verbal and visual cues for functional problem solving during a basic money management and medication management task. Patient also performed functional reading comprehension tasks with Mod I. Patient's mother present and educated regarding patient's current cognitive deficits, goals of skilled SLP intervention and general TBI progression. She verbalized understanding. Patient left upright in bed with alarm on and all needs within reach. Continue with current plan of care.      Pain Pain Assessment Pain Score: 8  Pain Type: Acute pain Pain Location: Head Pain Descriptors / Indicators: Aching Pain Onset: On-going Patients Stated Pain Goal: 2 Pain Intervention(s): Cold applied;Repositioned;Distraction, premedicated   Therapy/Group: Individual Therapy  Elif Yonts 01/03/2021, 2:37 PM

## 2021-01-03 NOTE — Progress Notes (Signed)
Physical Therapy Session Note  Patient Details  Name: Denise [name] MRN: [id number] Date of Birth: 20-Jan-1996  Today's Date: 01/03/2021 PT Individual Time: 8182-9937 and 1449-1530 PT Individual Time Calculation (min): 55 min and 41 min  Short Term Goals: Week 1:  PT Short Term Goal 1 (Week 1): Pt will ambulate >325ft w/supervision on level surfaces PT Short Term Goal 2 (Week 1): Gait >147ft on uneve/outdoor surfaces w/cga PT Short Term Goal 3 (Week 1): ascend/descend 12 stairs w/1 rail and cga PT Short Term Goal 4 (Week 1): Pt will tolerate BERG or other appropriate balance assessment PT Short Term Goal 5 (Week 1): Pt will recall directions from main treatment gym used in her care to her room on unit without cues required  Skilled Therapeutic Interventions/Progress Updates:     1st Session: Pt received supine in bed and agrees to therapy. Pain reported in R heel. Number not provided. PT provides rest breaks as needed to manage pain. Supine to sit with supervision. Pt attempts to stand from bed prior to PT cueing and PT educates pt on importance of allowing staff to assist with out of bed mobility to decrease risk of fall. Pt verbalizes understanding but demos impulsivity with mobility throughout session. Pt performs stand step transfer to toilet with CGA. Stand pivot transfer to [facility] with CGA. WC transport to gym for time management. Pt perform stand pivot to mat with CGA and cues on positioning. Pt ambulates >300' without AD and with minA from PT for occasional (2-3) losses of balance. Pt cues to slow gait velocity and increased stride width to improve safety. Pt attempts to stand to perform BITS NMR but quickly fatigues and requests to sit down. Following seated rest break, pt stands and performs memory task on BITS without difficulty. WC transport back to room. Stand pivot back to bed with minA. Pt left supine in bed with alarm intact and all needs within reach.  2nd Session: Pt received supine  in bed and agrees to therapy. No complaint of pain. Supine to sit with supervision and use of bed features. Stand pivot transfer to [facility] with CGA. WC transport to gym for time management. Session focused on gait training with cognitive overlay as well as obstacle course for increased balance challenge. Pt ambulates around cones and over unstable surfaces such as airex mat with PT providing primarily CGA and occasional minA due to slight LOBs. PT cues for decreased gait speed due to pt occasionally demonstrating scissoring gait pattern and hitting feet together during swing phase. Pt progresses to performing obstacle course with cognitive challenge, with PT providing pt with "restaurant order" for pt to remember and recite following ambulation through obstacles. Pt is able to remember order with very minimal cuing. Progression of challenge includes longer list of food items with modifiers and timing included, with pt continuing to perform memory tasks very well but also continuing to have occasional LOBs and scissoring gait. Longest bout of ambulation ~250' with minA.  PT educates mother on assisting pt to bathroom and has mother demonstrate guarding of pt for simulated bathroom visit. Mother signed off on caregiver training to assist pt to bathroom. Pt left supine in bed with alarm intact and all needs within reach.  Therapy Documentation Precautions:  Precautions Precautions: Fall,Cervical Precaution Comments: C collar Required Braces or Orthoses: Cervical Brace,Splint/Cast Cervical Brace: Hard collar,At all times Splint/Cast: L wrist support Restrictions Weight Bearing Restrictions: No LUE Weight Bearing: Weight bearing as tolerated  Therapy/Group: Individual Therapy  Beau Fanny, PT, DPT 01/03/2021, 3:36 PM

## 2021-01-04 ENCOUNTER — Inpatient Hospital Stay (HOSPITAL_COMMUNITY): Payer: 59

## 2021-01-04 ENCOUNTER — Inpatient Hospital Stay (HOSPITAL_COMMUNITY): Payer: 59 | Admitting: Physical Therapy

## 2021-01-04 ENCOUNTER — Inpatient Hospital Stay (HOSPITAL_COMMUNITY): Payer: 59 | Admitting: Speech Pathology

## 2021-01-04 LAB — CULTURE, BLOOD (ROUTINE X 2)
Culture: NO GROWTH
Culture: NO GROWTH
Special Requests: ADEQUATE
Special Requests: ADEQUATE

## 2021-01-04 MED ORDER — TOPIRAMATE 25 MG PO TABS
25.0000 mg | ORAL_TABLET | Freq: Two times a day (BID) | ORAL | Status: DC
  Administered 2021-01-04 – 2021-01-17 (×27): 25 mg via ORAL
  Filled 2021-01-04 (×26): qty 1

## 2021-01-04 NOTE — Progress Notes (Signed)
Physical Therapy Session Note  Patient Details  Name: ILO [name] MRN: [id number] Date of Birth: 1996/08/03  Today's Date: 01/04/2021 PT Individual Time: 1010-1050 PT Individual Time Calculation (min): 40 min   Short Term Goals: Week 1:  PT Short Term Goal 1 (Week 1): Pt will ambulate >335f w/supervision on level surfaces PT Short Term Goal 2 (Week 1): Gait >1538fon uneve/outdoor surfaces w/cga PT Short Term Goal 3 (Week 1): ascend/descend 12 stairs w/1 rail and cga PT Short Term Goal 4 (Week 1): Pt will tolerate BERG or other appropriate balance assessment PT Short Term Goal 5 (Week 1): Pt will recall directions from main treatment gym used in her care to her room on unit without cues required  Skilled Therapeutic Interventions/Progress Updates:  Pt presented in bed sleeping but easily aroused with mom present. Pt agreeable to therapy. Pt c/o of pain in L shoulder intermittently but resolved with rest. Pt requesting to use bathroom prior to leaving room. Performed supine to long sit in bed with increased time and turned to EOB. Pt then ambulated to toilet HHA and performed toilet transfers with close S. Pt returned to EOB in same manner as prior and then impulsively got up to walk to dresser when PTA's back was turned. Explained to pt that she should not just get up without warning as sill considered a fall risk. Pt stated that this is what she will be going when she goes home. PTA and mom reinforced that she's not cleared to move about on own yet with pt verbalizing understanding. Pt then ambulated to dresser and opened x 3 drawers to obtain new shirt. Pt returned to EOB and doffed shirt with close S and donned new shirt with minA to clear shirt over head. Pt then ambulated to w/c and transported to day room for time management and energy conservation. Pt then participated in dual task activities walking obstacle cours (stepping over thresholds, weaving through cones and stepping onto/over 4in  step). Pt required to count x 2's, x5's and holding onto ball while counting. Pt with increased lateral sway but no LOB and although pt did not miss any numbers required increased time to recall higher numbers when counting by 2's. Pt then ambulated back to room with CGA with pt using room signs to accurately find room. Pt returned to bed at end of session and left sitting in bed to color with bed alarm on, call bell within reach and needs met.      Therapy Documentation Precautions:  Precautions Precautions: Fall,Cervical Precaution Comments: C collar Required Braces or Orthoses: Cervical Brace,Splint/Cast Cervical Brace: Hard collar,At all times Splint/Cast: L wrist support Restrictions Weight Bearing Restrictions: No LUE Weight Bearing: Weight bearing as tolerated General:   Vital Signs: Therapy Vitals Temp: 97.8 F (36.6 C) Pulse Rate: 85 Resp: 18 BP: 101/65 Patient Position (if appropriate): Lying Oxygen Therapy SpO2: 99 % O2 Device: Room Air Pain: Pain Assessment Pain Scale: 0-10 Pain Score: 3  Mobility:   Locomotion :    Trunk/Postural Assessment :    Balance:   Exercises:   Other Treatments:      Therapy/Group: Individual Therapy  Suezette Lafave 01/04/2021, 4:22 PM

## 2021-01-04 NOTE — Progress Notes (Signed)
Occupational Therapy Session Note  Patient Details  Name: DELAYNIE STETZER MRN: [id number] Date of Birth: 04-18-1996  Today's Date: 01/04/2021 OT Individual Time: 4481-8563 OT Individual Time Calculation (min): 60 min    Skilled Therapeutic Interventions/Progress Updates:     Pt received in bed agreeable to OT wanting to work on her hair and wash her scalp. Pt declines shower until mother present again. Pt completes stand pivot transfers to w/c with CGA. OT washes some dried blood off of forehead but educates pt that some dried blood and hair is attatched to scalp that is still healing and pulling at it may cause it to bleed. Pt requesting to comb hair. Pt hair is severely matted. Ot gently educates while applying detangler spray that with pt hair in the condition it is, it will likley need to be cut to get to pt scalp and see if there is any skin breakdown. By feeling around in opening in hair, dried blood extends behind ear. Some hair able ot be pulled out and combed. detangled hair, braided and placed in pigtails to keep from re matting.  Pt left at end of session in bed with exit alarm on, call light in reach and all needs met  Therapy Documentation Precautions:  Precautions Precautions: Fall,Cervical Precaution Comments: C collar Required Braces or Orthoses: Cervical Brace,Splint/Cast Cervical Brace: Hard collar,At all times Splint/Cast: L wrist support Restrictions Weight Bearing Restrictions: No LUE Weight Bearing: Weight bearing as tolerated General:   Vital Signs: Therapy Vitals Temp: 98.1 F (36.7 C) Pulse Rate: 85 BP: 100/62 Patient Position (if appropriate): Lying Oxygen Therapy SpO2: 99 % O2 Device: Room Air Pain: Pain Assessment Pain Score: Asleep ADL: ADL Eating: Set up Grooming: Setup Upper Body Bathing: Setup Where Assessed-Upper Body Bathing: Shower Lower Body Bathing: Contact guard Where Assessed-Lower Body Bathing: Shower Upper Body Dressing: Minimal  assistance Lower Body Dressing: Moderate assistance Toileting: Moderate assistance Where Assessed-Toileting: Glass blower/designer: Therapist, music Method: Magazine features editor: Curator Method: Heritage manager: Research scientist (life sciences)   Exercises:   Other Treatments:     Therapy/Group: Individual Therapy  Tonny Branch 01/04/2021, 6:51 AM

## 2021-01-04 NOTE — Progress Notes (Addendum)
Speech Language Pathology Daily Session Note  Patient Details  Name: Denise [name] MRN: [id number] Date of Birth: 1996/01/21  Today's Date: 01/04/2021 SLP Individual Time: 2706-2376 SLP Individual Time Calculation (min): 40 min  Short Term Goals: Week 1: SLP Short Term Goal 1 (Week 1): Patient will demonstrate functional problem solving for mildly complex tasks with Min verbal cues. SLP Short Term Goal 2 (Week 1): Patient will recall new, daily information with Min verbal cues. SLP Short Term Goal 3 (Week 1): Patient will demonstrate selective attention in a mildly distracting enviornment for ~45 minutes with Min verbal cues for redirection. SLP Short Term Goal 4 (Week 1): Patient will self-monitor and correct errors during functional tasks with Min verbal cues.  Skilled Therapeutic Interventions: Skilled treatment session focused on cognitive goals. Patient was agreeable to SLP session and transferred to the wheelchair with supervision. SLP facilitated session by providing extra time and overall Mod A verbal cues for problem solving and organization during a mildly complex scheduling and money management task. Patient was aware of most errors and required overall Min verbal cues to self-correct. Towards end of session, patient's scalp wound near her hairline began to spontaneously bleed. RN aware and addressed.  Patient transferred back to bed at end of session and left with alarm on and all needs within reach. Continue with current plan of care.      Pain No/Denies Pain   Therapy/Group: Individual Therapy  Sherman Lipuma 01/04/2021, 3:08 PM

## 2021-01-04 NOTE — Progress Notes (Signed)
Physical Therapy Session Note  Patient Details  Name: Denise [name] MRN: [id number] Date of Birth: 1996/02/26  Today's Date: 01/04/2021 PT Individual Time: 4431-5400 PT Individual Time Calculation (min): 41 min   Short Term Goals: Week 1:  PT Short Term Goal 1 (Week 1): Pt will ambulate >360ft w/supervision on level surfaces PT Short Term Goal 2 (Week 1): Gait >162ft on uneve/outdoor surfaces w/cga PT Short Term Goal 3 (Week 1): ascend/descend 12 stairs w/1 rail and cga PT Short Term Goal 4 (Week 1): Pt will tolerate BERG or other appropriate balance assessment PT Short Term Goal 5 (Week 1): Pt will recall directions from main treatment gym used in her care to her room on unit without cues required  Skilled Therapeutic Interventions/Progress Updates:      Pt received supine in bed and agrees to therapy. Reports pain in head and neck, as well as ribs. During session RN provides pain meds to address symptoms and PT provides rest breaks as needed, and also assists with tightening C-collar for improved fit and support.  Supine to sit with minA and cues on logrolling technique to maintain spinal precautions. Pt able to don socks independently and PT assists with donning shoes at EOB. Sit to stand and stand step transfer to [facility] with CGA and cues for positioning. WC transport to gym for energy conservation. Stand pivot to mat table with CGA. Pt performs gait training, initially ambulating 180' x2 with CGA/minA at hips for stability due to occasional slight LOB, especially during sudden turns. 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 Pt performs forward and backward ambulation x30' for balance training. Pt then ambulates back to room x100' with CGA. Sit to supine with  supervision. Pt left supine in bed with alarm intact and all needs within reach.  Therapy Documentation Precautions:  Precautions Precautions: Fall,Cervical Precaution Comments: C collar Required Braces or Orthoses: Cervical Brace,Splint/Cast Cervical Brace: Hard collar,At all times Splint/Cast: L wrist support Restrictions Weight Bearing Restrictions: No LUE Weight Bearing: Weight bearing as tolerated   Therapy/Group: Individual Therapy  Beau Fanny, PT, DPT 01/04/2021, 9:08 AM

## 2021-01-04 NOTE — IPOC Note (Signed)
Overall Plan of Care [facility]) Patient Details Name: KINBERLY PERRIS MRN: [id number] DOB: 22-Apr-1996  Admitting Diagnosis: TBI (traumatic brain injury) [facility])  Hospital Problems: Principal Problem:   TBI (traumatic brain injury) [facility])     Functional Problem List: Nursing Behavior,Bladder,Bowel,Medication Management,Motor,Pain,Safety,Skin Integrity  PT Balance,Behavior,Endurance,Motor,Pain,Safety,Skin Integrity  OT Balance,Behavior,Cognition,Endurance,Pain,Safety,Motor  SLP Cognition  TR         Basic ADL's: OT Bathing,Dressing,Toileting     Advanced  ADL's: OT       Transfers: PT Bed Mobility,Bed to Chair,Car,Furniture,Floor  OT Toilet,Tub/Shower     Locomotion: PT Ambulation,Stairs     Additional Impairments: OT None  SLP Social Cognition   Problem Solving,Memory,Attention,Awareness  TR      Anticipated Outcomes Item Anticipated Outcome  Self Feeding independent  Swallowing      Basic self-care  supervision  Toileting  supervision   Bathroom Transfers supervision  Bowel/Bladder  To remain continent of bowel/bladderfor the duration of rehab.  Transfers  supervision  Locomotion  supervision  Communication     Cognition  Supervision  Pain  <2  Safety/Judgment  able to call for help and express need   Therapy Plan: PT Intensity: Minimum of 1-2 x/day ,45 to 90 minutes PT Frequency: 5 out of 7 days PT Duration Estimated Length of Stay: 12-14 OT Intensity: Minimum of 1-2 x/day, 45 to 90 minutes OT Frequency: 5 out of 7 days OT Duration/Estimated Length of Stay: 15-20 days SLP Intensity: Minumum of 1-2 x/day, 30 to 90 minutes SLP Frequency: 3 to 5 out of 7 days SLP Duration/Estimated Length of Stay: 12-14 days   Due to the current state of emergency, patients may not be receiving their 3-hours of Medicare-mandated therapy.   Team Interventions: Nursing Interventions Patient/Family Education,Bladder Management,Bowel Management,Skin Care/Wound  Management,Medication Management,Pain Management,Discharge Planning,Disease Management/Prevention  PT interventions Ambulation/gait training,DME/adaptive equipment instruction,Community reintegration,Neuromuscular re-education,Psychosocial support,Stair training,UE/LE Strength taining/ROM,Balance/vestibular training,Discharge planning,Pain management,Skin care/wound management,Therapeutic Activities,UE/LE Coordination activities,Cognitive remediation/compensation,Functional mobility training,Patient/family education,Therapeutic Exercise  OT Interventions Balance/vestibular training,Cognitive remediation/compensation,Discharge planning,Functional mobility training,Psychosocial support,Therapeutic Activities,Patient/family education,Neuromuscular re-education,Self Care/advanced ADL retraining,Therapeutic Exercise,UE/LE Strength taining/ROM,Pain management,DME/adaptive equipment instruction  SLP Interventions Cognitive remediation/compensation,Internal/external aids,Therapeutic Activities,Environmental controls,Cueing hierarchy,Functional tasks,Patient/family education  TR Interventions    SW/CM Interventions Discharge Planning,Psychosocial Support,Patient/Family Education   Barriers to Discharge MD  Medical stability  Nursing      PT Behavior impulsive, decreased safety awareness  OT      SLP      SW       Team Discharge Planning: Destination: PT-Home ,OT- Home , SLP-Home Projected Follow-up: PT-24 hour supervision/assistance, OT-  Home health OT, SLP-Outpatient SLP,24 hour supervision/assistance Projected Equipment Needs: PT-To be determined, OT- Tub/shower seat, SLP-None recommended by SLP Equipment Details: PT-likely no needs, OT-pt's mom will obtain a shower chair Patient/family involved in discharge planning: PT- Family member/caregiver,  OT-Patient,Family member/caregiver, SLP-Patient  MD ELOS: 01/13/21 Medical Rehab Prognosis:  Excellent Assessment: The patient has been admitted for  CIR therapies with the diagnosis of TBI with polytrauma. The team will be addressing functional mobility, strength, stamina, balance, safety, adaptive techniques and equipment, self-care, bowel and bladder mgt, patient and caregiver education, NMR, behavior, pain mgt, wound care, community reentregration, cognition, communication. Goals have been set at supervision with mobility, self-care and cognition.   Due to the current state of emergency, patients may not be receiving their 3 hours per day of Medicare-mandated therapy.    Ranelle Oyster, MD, FAAPMR      See Team Conference Notes for weekly updates to the plan of  care
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
## 2021-01-05 ENCOUNTER — Inpatient Hospital Stay (HOSPITAL_COMMUNITY): Payer: 59 | Admitting: Speech Pathology

## 2021-01-05 ENCOUNTER — Inpatient Hospital Stay (HOSPITAL_COMMUNITY): Payer: 59

## 2021-01-05 ENCOUNTER — Inpatient Hospital Stay (HOSPITAL_COMMUNITY): Payer: 59 | Admitting: Occupational Therapy

## 2021-01-05 MED ORDER — FENTANYL 12 MCG/HR TD PT72
1.0000 | MEDICATED_PATCH | TRANSDERMAL | Status: DC
  Administered 2021-01-05 – 2021-01-14 (×4): 1 via TRANSDERMAL
  Filled 2021-01-05 (×4): qty 1

## 2021-01-05 MED ORDER — HYDROGEN PEROXIDE 3 % EX SOLN
CUTANEOUS | Status: DC | PRN
  Filled 2021-01-05: qty 473

## 2021-01-05 NOTE — Progress Notes (Signed)
Pt dressing on head is soiled w/ fresh blood on it.new blood noted around the ear and neck. Pt c/o of pain around the R ear. Area cleaned with normal saline, and non adherent dressing placed. Provider will be made aware during morning rounds.

## 2021-01-05 NOTE — Progress Notes (Signed)
Patient ID: Denise [name], female   DOB: [birth year], 25 y.o.   MRN: [id number]   SW faxed [facility] referral for outpatient PT/OT/SLP.  SW met with pt mother in room to provide updates on scheduled neurospych appointment, and above on outpatient. Pt mother continues to be concerned about pt desire to return to work and inability to return to work. SW encouraged mother to be patient with her daughter as she continues to adjust to the changes,and outpatient therapy can assess her on when she is appropriate to return to work. SW informed will continue to provide updates.   Loralee Pacas, MSW, [place] Office: [phone] Cell: [phone] Fax: [phone]

## 2021-01-05 NOTE — Progress Notes (Signed)
Speech Language Pathology Daily Session Note  Patient Details  Name: KERRY ODONOHUE MRN: [id number] Date of Birth: 1996-01-04  Today's Date: 01/05/2021 SLP Individual Time: 1415-1450 SLP Individual Time Calculation (min): 35 min  Short Term Goals: Week 1: SLP Short Term Goal 1 (Week 1): Patient will demonstrate functional problem solving for mildly complex tasks with Min verbal cues. SLP Short Term Goal 2 (Week 1): Patient will recall new, daily information with Min verbal cues. SLP Short Term Goal 3 (Week 1): Patient will demonstrate selective attention in a mildly distracting enviornment for ~45 minutes with Min verbal cues for redirection. SLP Short Term Goal 4 (Week 1): Patient will self-monitor and correct errors during functional tasks with Min verbal cues.  Skilled Therapeutic Interventions: Skilled treatment session focused on cognitive goals. SLP facilitated session by providing extra time and overall Mod A verbal cues for reasoning and problem solving during a complex scheduling task. While patient was working, her scalp wound began to spontaneously bleed. Patient reported she "felt it" and reported feeling "woozy" and requested to lay down. RN aware. Patient missed remaining 10 minutes of session due to not feeling well. Continue with current plan of care.      Pain Pain in neck, patient repositioned, RN aware  Therapy/Group: Individual Therapy  Arionne Iams 01/05/2021, 3:09 PM

## 2021-01-05 NOTE — Progress Notes (Signed)
Physical Therapy Session Note  Patient Details  Name: Denise [name] MRN: [id number] Date of Birth: 1996/01/31  Today's Date: 01/05/2021 PT Individual Time: 9381-0175 and 1303-1330 PT Individual Time Calculation (min): 42 min and 27 min  Short Term Goals: Week 1:  PT Short Term Goal 1 (Week 1): Pt will ambulate >320ft w/supervision on level surfaces PT Short Term Goal 2 (Week 1): Gait >179ft on uneve/outdoor surfaces w/cga PT Short Term Goal 3 (Week 1): ascend/descend 12 stairs w/1 rail and cga PT Short Term Goal 4 (Week 1): Pt will tolerate BERG or other appropriate balance assessment PT Short Term Goal 5 (Week 1): Pt will recall directions from main treatment gym used in her care to her room on unit without cues required  Skilled Therapeutic Interventions/Progress Updates:     1st Session: Pt received ambulating back from bathroom with mother. Agreeable to therapy. Reports some pain in L wrist. Number not provided. PT provides rest breaks and bracing to manage pain symptoms. Stand step transfer to [facility] with CGA. WC transport to gym for time management. Session focused on gait training with community reintegration. Pt ambulates with CGA to elevator and manages buttons to go downstairs to atrium. Pt ambulates in crowded open environment of atirum with CGA, transferring in and out of booths with supervision and cues on positioning and sequencing for safety. Pt then ambulates into [employer] and manages ordering beverage and preparing at counter with focus on maintaining dynamic balance. Pt then carries beverage to table and requires minA/modA for LOB when making sudden change in direction. Following seated rest break pt ambulates back to elevator and manages buttons to return to 4th floor. Pt ambulates multiple bouts throughout session with longest consecutive bout ~400', primarily with CGA. Pt performs toilet transfer with CGA. Transfer back to bed with CGA and sit to supine mod(I) with bed features. Left  supine in bed with alarm intact and all needs within reach.  2nd Session: Pt received supine in bed and agrees to therapy. No complaint of pain. Supine to sit mod(I) with bed features. Sit to stand and stand step transfer to [facility] with CGA and cues on positioning. WC transport to gym for time management. Pt performs gait training with 4lb ankle weights on each leg to provide proprioceptive input and to strengthen bilateral hip flexors due to pt tendency to catch toes during swing phase. Pt completes 2x200' with primarily CGA but x1 instance of modA due to forward LOB. Pt performs 2x10 LAQs with ankle weights in place. Pt also marches in place with high knees for strengthening and balance training, requiring minA but progressing to CGA. WC transport back to room. Pt left supine in bed with alarm intact and all needs within reach.  Therapy Documentation Precautions:  Precautions Precautions: Fall,Cervical Precaution Comments: C collar Required Braces or Orthoses: Cervical Brace,Splint/Cast Cervical Brace: Hard collar,At all times Splint/Cast: L wrist support Restrictions Weight Bearing Restrictions: No LUE Weight Bearing: Weight bearing as tolerated   Therapy/Group: Individual Therapy  Beau Fanny, PT, DPT 01/05/2021, 4:10 PM
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
## 2021-01-05 NOTE — Progress Notes (Signed)
Occupational Therapy Session Note  Patient Details  Name: Denise [name] MRN: [id number] Date of Birth: August 09, 1996  Today's Date: 01/05/2021 OT Individual Time: 2423-5361 OT Individual Time Calculation (min): 60 min    Short Term Goals: Week 1:  OT Short Term Goal 1 (Week 1): Pt will be able to don a shirt over cervical collar with supervision. OT Short Term Goal 2 (Week 1): Pt will be able to don underwear and pants with CGA. OT Short Term Goal 3 (Week 1): Pt will be able to complete toileting with CGA. OT Short Term Goal 4 (Week 1): Pt will demonstrate improved attention span by completing grooming with min cues.  Skilled Therapeutic Interventions/Progress Updates:    Pt received in bed and would like to shower but a second set of cervical pads were not available. Pt agreeable to a sponge bath at sink. Mom present and telesitter put on privacy mode. L wrist brace adjusted as it was on backwards.  Min to sit to EOB and then CGA with sit to stand, ambulation to toilet, toileting, ambulation to wc, standing to wash bottom.  From wc pt able to wash her body and don tshirt with min to pull over head.  Donned underwear and pants with min for balance primarily using right hand to pull pants up.   Pt and mom very concerned about cleaning blood around ear. With light pressure, began to wash ear lobe but it began bleeding again. I expressed I would be more comfortable with nursing assisting as I do not know the healing stage of her sutures.   Today, less labile, improved attention with only one outburst of frustration towards mom when her mom brought up something about her job.    Pt resting in wc with telesitter on and mom in the room.   Therapy Documentation Precautions:  Precautions Precautions: Fall,Cervical Precaution Comments: C collar Required Braces or Orthoses: Cervical Brace,Splint/Cast Cervical Brace: Hard collar,At all times Splint/Cast: L wrist support Restrictions Weight  Bearing Restrictions: No LUE Weight Bearing: Weight bearing as tolerated   Pain: frequent c/o discomfort in abdomen/ribs - premedicated   ADL: ADL Eating: Set up Grooming: Setup Upper Body Bathing: Setup Where Assessed-Upper Body Bathing: Shower Lower Body Bathing: Contact guard Where Assessed-Lower Body Bathing: Shower Upper Body Dressing: Minimal assistance Lower Body Dressing: Moderate assistance Toileting: Moderate assistance Where Assessed-Toileting: Teacher, adult education: Furniture conservator/restorer Method: Event organiser: Administrator, arts Method: Designer, industrial/product: Transfer tub bench,Grab bars  Therapy/Group: Individual Therapy  SAGUIER,JULIA 01/05/2021, 11:43 AM

## 2021-01-06 ENCOUNTER — Inpatient Hospital Stay (HOSPITAL_COMMUNITY): Payer: 59 | Admitting: Speech Pathology

## 2021-01-06 ENCOUNTER — Inpatient Hospital Stay (HOSPITAL_COMMUNITY): Payer: 59

## 2021-01-06 DIAGNOSIS — R4689 Other symptoms and signs involving appearance and behavior: Secondary | ICD-10-CM

## 2021-01-06 DIAGNOSIS — S069X0S Unspecified intracranial injury without loss of consciousness, sequela: Secondary | ICD-10-CM

## 2021-01-06 LAB — CBC
HCT: 26.4 % — ABNORMAL LOW (ref 36.0–46.0)
Hemoglobin: 7.9 g/dL — ABNORMAL LOW (ref 12.0–15.0)
MCH: 30.6 pg (ref 26.0–34.0)
MCHC: 29.9 g/dL — ABNORMAL LOW (ref 30.0–36.0)
MCV: 102.3 fL — ABNORMAL HIGH (ref 80.0–100.0)
Platelets: 720 10*3/uL — ABNORMAL HIGH (ref 150–400)
RBC: 2.58 MIL/uL — ABNORMAL LOW (ref 3.87–5.11)
RDW: 13.1 % (ref 11.5–15.5)
WBC: 12.2 10*3/uL — ABNORMAL HIGH (ref 4.0–10.5)
nRBC: 0 % (ref 0.0–0.2)

## 2021-01-06 LAB — COMPREHENSIVE METABOLIC PANEL
ALT: 73 U/L — ABNORMAL HIGH (ref 0–44)
AST: 37 U/L (ref 15–41)
Albumin: 3.3 g/dL — ABNORMAL LOW (ref 3.5–5.0)
Alkaline Phosphatase: 161 U/L — ABNORMAL HIGH (ref 38–126)
Anion gap: 12 (ref 5–15)
BUN: 11 mg/dL (ref 6–20)
CO2: 27 mmol/L (ref 22–32)
Calcium: 9.3 mg/dL (ref 8.9–10.3)
Chloride: 99 mmol/L (ref 98–111)
Creatinine, Ser: 0.61 mg/dL (ref 0.44–1.00)
GFR, Estimated: >60 mL/min (ref >60)
Glucose, Bld: 76 mg/dL (ref 70–99)
Potassium: 3.4 mmol/L — ABNORMAL LOW (ref 3.5–5.1)
Sodium: 138 mmol/L (ref 135–145)
Total Bilirubin: 0.5 mg/dL (ref 0.3–1.2)
Total Protein: 7.9 g/dL (ref 6.5–8.1)

## 2021-01-06 LAB — OSMOLALITY, URINE: Osmolality, Ur: 101 mOsm/kg — ABNORMAL LOW (ref 300–900)

## 2021-01-06 LAB — OSMOLALITY: Osmolality: 298 mOsm/kg — ABNORMAL HIGH (ref 275–295)

## 2021-01-06 MED ORDER — DESMOPRESSIN ACETATE 0.1 MG PO TABS
0.1000 mg | ORAL_TABLET | Freq: Two times a day (BID) | ORAL | Status: DC
  Administered 2021-01-07 – 2021-01-17 (×22): 0.1 mg via ORAL
  Filled 2021-01-06 (×23): qty 1

## 2021-01-06 NOTE — Plan of Care (Signed)
  Problem: Consults Goal: RH BRAIN INJURY PATIENT EDUCATION Description: Description: See Patient Education module for eduction specifics Outcome: Progressing   Problem: RH BOWEL ELIMINATION Goal: RH STG MANAGE BOWEL WITH ASSISTANCE Description: STG Manage Bowel with mod Assistance. Outcome: Progressing   Problem: RH BLADDER ELIMINATION Goal: RH STG MANAGE BLADDER WITH ASSISTANCE Description: STG Manage Bladder With Mod I Assistance Outcome: Progressing   Problem: RH SKIN INTEGRITY Goal: RH STG SKIN FREE OF INFECTION/BREAKDOWN Description: Assess skin q shift and as needed Outcome: Progressing Goal: RH STG MAINTAIN SKIN INTEGRITY WITH ASSISTANCE Description: STG Maintain Skin Integrity With MOD I Assistance. Outcome: Progressing   Problem: RH SAFETY Goal: RH STG ADHERE TO SAFETY PRECAUTIONS W/ASSISTANCE/DEVICE Description: STG Adhere to Safety Precautions With Mod I Assistance/Device. Outcome: Progressing Goal: RH STG DECREASED RISK OF FALL WITH ASSISTANCE Description: STG Decreased Risk of Fall With Mod I Assistance. Outcome: Progressing   Problem: RH PAIN MANAGEMENT Goal: RH STG PAIN MANAGED AT OR BELOW PT'S PAIN GOAL Description: <2 Outcome: Progressing

## 2021-01-06 NOTE — Progress Notes (Signed)
Serum osmolality 298 with urine osmolality now down to 101 despite DDAVP  Plan:  Increase DDAVP to 0.1mg  bid Recheck labs next week.  Ranelle Oyster, MD, [facility] Physical Medicine & Rehabilitation 01/06/2021

## 2021-01-06 NOTE — Progress Notes (Signed)
Physical Therapy Session Note  Patient Details  Name: Denise [name] MRN: [id number] Date of Birth: [birth year]  Today's Date: 01/06/2021 PT Individual Time: 1030-1100 PT Individual Time Calculation (min): 30 min   Short Term Goals: Week 1:  PT Short Term Goal 1 (Week 1): Pt will ambulate >361ft w/supervision on level surfaces PT Short Term Goal 2 (Week 1): Gait >141ft on uneve/outdoor surfaces w/cga PT Short Term Goal 3 (Week 1): ascend/descend 12 stairs w/1 rail and cga PT Short Term Goal 4 (Week 1): Pt will tolerate BERG or other appropriate balance assessment PT Short Term Goal 5 (Week 1): Pt will recall directions from main treatment gym used in her care to her room on unit without cues required  Skilled Therapeutic Interventions/Progress Updates:    session focused on functional mobility and dynamic gait and balance as well as cognitive remediation.   Pt expresses being upset with her mother and wants to get out of the room. Comes to EOB modified independent using HOB elevated and bed rail for support. Dons shoes with extra time and supervision. Sit > stands with CGA/supervision and steadying assist for dynamic standing balance and gait while she works to locate her mask in the room (lifts up blankets and sheets and then with cues able to locate on the table where it was last put). Functional dynamic gait in busy environment in hallway > 150' with CGA for balance with occasional use of rail in hallways for support (self selected). Practiced functional transfers in ADL apartment including regular bed and couch and simulated  Navigating through the kitchen including taking items of fridge and shelves with goal to simualte what she might use to prepare a meal (she chose pasta). Cues needed for attention to task as pt initially identifies the pasta ladle, plate, and a box from the pantry but did not complete task due to then becoming tearful and upset stating "this all really sucks" and reports  that her mom just told her she "cancelled" her apartment. Pt spends time describing the pride she has in her apt and how she has spent much time decorating her space, etc. Emotional support and therapeutic listening provided.   Pt aware she has had a brain injury but also demonstrates impaired insight as she states she feels like she could live on her own and doesn't need her mom to help her cooking and cleaning and managing her home. Returned back to room at end of session with overall CGA for mobility and doffs shoes independently and repositioned in the bed.  During session L wound on her head started to bleed a little, dabbed with a tissue and it had stopped upon return to room. [name] reports she plans on wiping and cleaning her face. Encouraged to notify RN if bleeding persisted or restarted. Per safety plan, pt's mom cleared for assisting with transfers.   Therapy Documentation Precautions:  Precautions Precautions: Fall,Cervical Precaution Comments: C collar Required Braces or Orthoses: Cervical Brace,Splint/Cast Cervical Brace: Hard collar,At all times Splint/Cast: L wrist support Restrictions Weight Bearing Restrictions: No LUE Weight Bearing: Weight bearing as tolerated    Pain:  reports some generalized pain to ribs and head. Does note R heel hurts some after initial mobility but does not complain of it again. Premedicated per report.   Therapy/Group: Individual Therapy  Karolee Stamps Darrol Poke, PT, DPT, CBIS  01/06/2021, 1:18 PM
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
## 2021-01-06 NOTE — Progress Notes (Signed)
Occupational Therapy Session Note  Patient Details  Name: Denise [name] MRN: [id number] Date of Birth: [birth year]  Today's Date: 01/06/2021 OT Individual Time: 1115-1200 OT Individual Time Calculation (min): 45 min    Short Term Goals: Week 1:  OT Short Term Goal 1 (Week 1): Pt will be able to don a shirt over cervical collar with supervision. OT Short Term Goal 2 (Week 1): Pt will be able to don underwear and pants with CGA. OT Short Term Goal 3 (Week 1): Pt will be able to complete toileting with CGA. OT Short Term Goal 4 (Week 1): Pt will demonstrate improved attention span by completing grooming with min cues.  Skilled Therapeutic Interventions/Progress Updates:    1:1. Pt received in bed crying because mom "wont let me take my collar off to clean my ear. I just want to clean the blood out of my ear." OT provided A to clean out ear and readjust neck breace in supine. Pt requires multiple minutes of empathetic listening as well as reassurance that needing 14/7 supervision is standard after TBI. Pt eventually calms and agreeable to mobility to "get out of the room" while walking in the hall, pt states she is getting dizzy. Pt remains seated and another clinician retrieves pt w/c. BP assessed seated 107/68. Pt returns to bed and BP 108/70. Pt feels better lying with HOB elevated. Pt provided with resource to access love your brain yoga program after C collar and follow up services finished with handout for pt and mother. Exited session with pt seated in bed, exit alarm on and call light in reach   Therapy Documentation Precautions:  Precautions Precautions: Fall,Cervical Precaution Comments: C collar Required Braces or Orthoses: Cervical Brace,Splint/Cast Cervical Brace: Hard collar,At all times Splint/Cast: L wrist support Restrictions Weight Bearing Restrictions: No LUE Weight Bearing: Weight bearing as tolerated General:   Vital Signs:   Pain: Pain Assessment Pain Score: 1   ADL: ADL Eating: Set up Grooming: Setup Upper Body Bathing: Setup Where Assessed-Upper Body Bathing: Shower Lower Body Bathing: Contact guard Where Assessed-Lower Body Bathing: Shower Upper Body Dressing: Minimal assistance Lower Body Dressing: Moderate assistance Toileting: Moderate assistance Where Assessed-Toileting: Teacher, adult education: Furniture conservator/restorer Method: Event organiser: Administrator, arts Method: Designer, industrial/product: Herbalist   Exercises:   Other Treatments:     Therapy/Group: Individual Therapy  Shon Hale 01/06/2021, 12:11 PM
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
## 2021-01-06 NOTE — Progress Notes (Signed)
Speech Language Pathology Daily Session Note  Patient Details  Name: Denise [name] MRN: [id number] Date of Birth: 1996-02-24  Today's Date: 01/06/2021 SLP Individual Time: 1330-1415 SLP Individual Time Calculation (min): 45 min  Short Term Goals: Week 1: SLP Short Term Goal 1 (Week 1): Patient will demonstrate functional problem solving for mildly complex tasks with Min verbal cues. SLP Short Term Goal 2 (Week 1): Patient will recall new, daily information with Min verbal cues. SLP Short Term Goal 3 (Week 1): Patient will demonstrate selective attention in a mildly distracting enviornment for ~45 minutes with Min verbal cues for redirection. SLP Short Term Goal 4 (Week 1): Patient will self-monitor and correct errors during functional tasks with Min verbal cues.  Skilled Therapeutic Interventions: Skilled treatment session focused on cognitive goals. Upon arrival, patient's call light was on and she was requesting to use the bathroom. Patient demonstrated increased impulsivity with ambulation, suspect due to urgency and required Min verbal cues for safety. Patient requested to eat her lunch and was able to alternate attention between a functional conversation and self-feeding with Mod verbal cues for redirection. Patient's mother was on the phone in her room and patient often attempted to interrupt her phone call to ask her mom non-relevant questions.  Patient required Mod verbal cues for recall of events from previous therapy sessions, therefore, a notebook was given to patient to utilize as a visual aid to maximize recall. Patient also participated in a basic recall task with overall Min verbal cues for use of strategies. Patient transferred back to bed at end of session and left with alarm on and all needs within reach. Continue with current plan of care.      Pain No/Denies Pain   Therapy/Group: Individual Therapy  Marti Mclane 01/06/2021, 3:00 PM

## 2021-01-06 NOTE — Discharge Instructions (Signed)
Inpatient Rehab Discharge Instructions  MAYARI MATUS Discharge date and time: No discharge date for patient encounter.   Activities/Precautions/ Functional Status: Activity: Cervical collar as directed Diet: regular diet Wound Care: Routine skin checks Functional status:  ___ No restrictions     ___ Walk up steps independently ___ 24/7 supervision/assistance   ___ Walk up steps with assistance ___ Intermittent supervision/assistance  ___ Bathe/dress independently ___ Walk with walker     ___ Bathe/dress with assistance ___ Walk Independently    ___ Shower independently ___ Walk with assistance    ___ Shower with assistance ___ No alcohol     ___ Return to work/school ________   COMMUNITY REFERRALS UPON DISCHARGE:    Outpatient: PT     OT    ST             Agency: [facility]     Phone: [phone]              Appointment Date/Time:*Please expect follow-up within 7-10 business days to schedule appointment. If you have not received follow-up, be sure to contact the site directly.    GENERAL COMMUNITY RESOURCES FOR PATIENT/FAMILY: Neuropsych appointment- Dr. Vella Kohler [phone]) -on Wednesday, February 2 at 1pm   Special Instructions: No driving smoking or alcohol  Follow-up neurology services Dr. Epimenio Foot [phone] for evaluation of possible multiple sclerosis   My questions have been answered and I understand these instructions. I will adhere to these goals and the provided educational materials after my discharge from the hospital.  Patient/Caregiver Signature _______________________________ Date __________  Clinician Signature _______________________________________ Date __________  Please bring this form and your medication list with you to all your follow-up doctor's appointments.

## 2021-01-06 NOTE — Progress Notes (Signed)
Physical Therapy Session Note  Patient Details  Name: Denise [name] MRN: [id number] Date of Birth: 04-13-1996  Today's Date: 01/06/2021 PT Individual Time: 0906-1000 PT Individual Time Calculation (min): 54 min   Short Term Goals: Week 1:  PT Short Term Goal 1 (Week 1): Pt will ambulate >366ft w/supervision on level surfaces PT Short Term Goal 2 (Week 1): Gait >157ft on uneve/outdoor surfaces w/cga PT Short Term Goal 3 (Week 1): ascend/descend 12 stairs w/1 rail and cga PT Short Term Goal 4 (Week 1): Pt will tolerate BERG or other appropriate balance assessment PT Short Term Goal 5 (Week 1): Pt will recall directions from main treatment gym used in her care to her room on unit without cues required  Skilled Therapeutic Interventions/Progress Updates:     Pt received supine in bed and agrees to therapy. Supine to sit with mod(I) use of bed features. Sit to stand with CGA. Pt ambulates x100' to dayroom with minA for several slight LOBs. PT cues for increased attention to task and safety due to pt tendency to become distracted, negatively affecting balance. PT provides pt with adjustment to facemask so as not to irritate laceration above ear which frquently bleeds durin gsession. Pt verbalizes improved feeling. Pt then performs NMR for standing balance, strength, and activity tolerance, with use of Wii balance board. PT provides minA to modA on balance board facilitating weight shifts and also for stability and to prevent falls. Pt educated on use of hip strategy to shift weight. Pt requires several seated rest breaks during activity. Pt ambulates 100' back to room with minA. Sit to supine mod(I). Left supine with alarm intact and all needs within reach.  Therapy Documentation Precautions:  Precautions Precautions: Fall,Cervical Precaution Comments: C collar Required Braces or Orthoses: Cervical Brace,Splint/Cast Cervical Brace: Hard collar,At all times Splint/Cast: L wrist  support Restrictions Weight Bearing Restrictions: No LUE Weight Bearing: Weight bearing as tolerated    Therapy/Group: Individual Therapy  Beau Fanny, PT, DPT 01/06/2021, 11:36 AM
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
## 2021-01-07 NOTE — Plan of Care (Signed)
  Problem: Consults Goal: RH BRAIN INJURY PATIENT EDUCATION Description: Description: See Patient Education module for eduction specifics 01/07/2021 1339 by Mauro Kaufmann L, LPN Outcome: Progressing 01/07/2021 1332 by Mauro Kaufmann L, LPN Outcome: Progressing   Problem: RH BOWEL ELIMINATION Goal: RH STG MANAGE BOWEL WITH ASSISTANCE Description: STG Manage Bowel with mod Assistance. 01/07/2021 1339 by Mauro Kaufmann L, LPN Outcome: Progressing 01/07/2021 1332 by Mauro Kaufmann L, LPN Outcome: Progressing   Problem: RH BLADDER ELIMINATION Goal: RH STG MANAGE BLADDER WITH ASSISTANCE Description: STG Manage Bladder With Mod I Assistance 01/07/2021 1339 by Mauro Kaufmann L, LPN Outcome: Progressing 01/07/2021 1332 by Mauro Kaufmann L, LPN Outcome: Progressing   Problem: RH SKIN INTEGRITY Goal: RH STG SKIN FREE OF INFECTION/BREAKDOWN Description: Assess skin q shift and as needed 01/07/2021 1339 by Gregory Dowe L, LPN Outcome: Progressing 01/07/2021 1332 by Mauro Kaufmann L, LPN Outcome: Progressing Goal: RH STG MAINTAIN SKIN INTEGRITY WITH ASSISTANCE Description: STG Maintain Skin Integrity With MOD I Assistance. 01/07/2021 1339 by Mauro Kaufmann L, LPN Outcome: Progressing 01/07/2021 1332 by Mauro Kaufmann L, LPN Outcome: Progressing   Problem: RH SAFETY Goal: RH STG ADHERE TO SAFETY PRECAUTIONS W/ASSISTANCE/DEVICE Description: STG Adhere to Safety Precautions With Mod I Assistance/Device. 01/07/2021 1339 by Mauro Kaufmann L, LPN Outcome: Progressing 01/07/2021 1332 by Mauro Kaufmann L, LPN Outcome: Progressing Goal: RH STG DECREASED RISK OF FALL WITH ASSISTANCE Description: STG Decreased Risk of Fall With Mod I Assistance. 01/07/2021 1339 by Mauro Kaufmann L, LPN Outcome: Progressing 01/07/2021 1332 by Mauro Kaufmann L, LPN Outcome: Progressing   Problem: RH PAIN MANAGEMENT Goal: RH STG PAIN MANAGED AT OR BELOW PT'S PAIN GOAL Description: <2 01/07/2021 1339 by Mauro Kaufmann L, LPN Outcome:  Progressing 01/07/2021 1332 by Mauro Kaufmann L, LPN Outcome: Progressing

## 2021-01-07 NOTE — Progress Notes (Signed)
Mother Caley Ciaramitaro) of patient will not be bedside in the morning and asked if provider could contact her with an update [phone])

## 2021-01-07 NOTE — Plan of Care (Signed)
  Problem: Consults Goal: RH BRAIN INJURY PATIENT EDUCATION Description: Description: See Patient Education module for eduction specifics Outcome: Progressing   Problem: RH BOWEL ELIMINATION Goal: RH STG MANAGE BOWEL WITH ASSISTANCE Description: STG Manage Bowel with mod Assistance. Outcome: Progressing   Problem: RH BLADDER ELIMINATION Goal: RH STG MANAGE BLADDER WITH ASSISTANCE Description: STG Manage Bladder With Mod I Assistance Outcome: Progressing   Problem: RH SKIN INTEGRITY Goal: RH STG SKIN FREE OF INFECTION/BREAKDOWN Description: Assess skin q shift and as needed Outcome: Progressing Goal: RH STG MAINTAIN SKIN INTEGRITY WITH ASSISTANCE Description: STG Maintain Skin Integrity With MOD I Assistance. Outcome: Progressing   Problem: RH SAFETY Goal: RH STG ADHERE TO SAFETY PRECAUTIONS W/ASSISTANCE/DEVICE Description: STG Adhere to Safety Precautions With Mod I Assistance/Device. Outcome: Progressing Goal: RH STG DECREASED RISK OF FALL WITH ASSISTANCE Description: STG Decreased Risk of Fall With Mod I Assistance. Outcome: Progressing   Problem: RH PAIN MANAGEMENT Goal: RH STG PAIN MANAGED AT OR BELOW PT'S PAIN GOAL Description: <2 Outcome: Progressing   

## 2021-01-07 NOTE — Progress Notes (Signed)
Patient's mother called concerning bed bath for patient. Nurse inquired about bed bath to patient and patient stated that she wanted to sleep. After patient was taken to the bathroom patient stated that she would like bath at night before bed. Nurse will communicate this to next shift nurse and tech.  Cletis Media, LPN

## 2021-01-07 NOTE — Progress Notes (Signed)
Patient presented with increased agitation and yelling throughout the day. Patient observed yelling at grandmother and mother concerning care issues. Patient presented with continuous leakage of dark red blood from left side of hair line near ear. Area cleansed and gauze placed so as to not have blood run in patient's ear. MD Raulkar notified. Patient continues to be monitored. Cletis Media, LPN

## 2021-01-08 ENCOUNTER — Inpatient Hospital Stay (HOSPITAL_COMMUNITY): Payer: 59

## 2021-01-08 NOTE — Plan of Care (Signed)
  Problem: Consults Goal: RH BRAIN INJURY PATIENT EDUCATION Description: Description: See Patient Education module for eduction specifics Outcome: Progressing   Problem: RH BOWEL ELIMINATION Goal: RH STG MANAGE BOWEL WITH ASSISTANCE Description: STG Manage Bowel with mod Assistance. Outcome: Progressing   Problem: RH BLADDER ELIMINATION Goal: RH STG MANAGE BLADDER WITH ASSISTANCE Description: STG Manage Bladder With Mod I Assistance Outcome: Progressing   Problem: RH SKIN INTEGRITY Goal: RH STG SKIN FREE OF INFECTION/BREAKDOWN Description: Assess skin q shift and as needed Outcome: Progressing Goal: RH STG MAINTAIN SKIN INTEGRITY WITH ASSISTANCE Description: STG Maintain Skin Integrity With MOD I Assistance. Outcome: Progressing   Problem: RH SAFETY Goal: RH STG ADHERE TO SAFETY PRECAUTIONS W/ASSISTANCE/DEVICE Description: STG Adhere to Safety Precautions With Mod I Assistance/Device. Outcome: Progressing Goal: RH STG DECREASED RISK OF FALL WITH ASSISTANCE Description: STG Decreased Risk of Fall With Mod I Assistance. Outcome: Progressing   Problem: RH PAIN MANAGEMENT Goal: RH STG PAIN MANAGED AT OR BELOW PT'S PAIN GOAL Description: <2 Outcome: Progressing   
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
## 2021-01-08 NOTE — Progress Notes (Addendum)
Physical Therapy Session Note  Patient Details  Name: Denise [name] MRN: [id number] Date of Birth: [birth year]  Today's Date: 01/08/2021 PT Individual Time: 1005-1015, 1105-1200, and 1400-1445 PT Individual Time Calculation (min): 10 min, 55 min, and 45 min  Short Term Goals: Week 1:  PT Short Term Goal 1 (Week 1): Pt will ambulate >329ft w/supervision on level surfaces PT Short Term Goal 2 (Week 1): Gait >136ft on uneve/outdoor surfaces w/cga PT Short Term Goal 3 (Week 1): ascend/descend 12 stairs w/1 rail and cga PT Short Term Goal 4 (Week 1): Pt will tolerate BERG or other appropriate balance assessment PT Short Term Goal 5 (Week 1): Pt will recall directions from main treatment gym used in her care to her room on unit without cues required  Skilled Therapeutic Interventions/Progress Updates:     Session 1 and 2: Patient in bed with her mother asleep in the recliner in the room upon PT arrival. Patient alert and agreeable to PT session. Patient reporting increased chest and neck pain, stated that LPN applied Fentanyl patch just prior to session and that it was making her feel "weird." Assessed chest pain, HR 91 bpm, patient with pain over ribs 2 bilaterally with palpation and B shoulder AROM, educated patient on ocation of rib fractures, per chart, and pain associate with fractures. Discussed pain management with patient and LPN, patient reports that the Fentanyl patch helps her pain and LPN reported that the side effects lasted ~1 hour before resolving the one other time the patient has worn the patch. Patient in agreement to keep the patch on, but requesting to hold session for an hour to let the side effects "wear off." Noted patient with active bleeding from L ear, cleaned with 4x4 gauze and bleeding resolved, LPN made aware and confirmed MD is aware. Patient in the bed with her mother in the room at end of session with breaks locked, bed alarm set and all needs in reach.  Patient sitting  EOB preparing for bathing with her mother assisting upon PT arrival. Patient reported that symptoms from the Fentanyl patch had resolved and eager to leave the room with PT, opting to bath at another time. Patient with poor attention and intermittent confusion, first stating she wanted to get dressed first, then stating she could leave in the clothes she had on, once clothes were laid out for her. Patient ambulated within the room with close supervision for safety without LOB.   Patient declined ambulating to the therapy gym due to intermittent dizziness, however, denied dizziness at this time. Transported patient to the day room with total A for time management and improved patient compliance with therapeutic activities. Once in the gym, patient expressed concerns and frustrations about d/c plan to her mother's house with her grandmother. Patient in tears and expressing significant stress response throughout discussion. Provided therapeutic listening and emotional support for patient. Will pass on patient's concerns to CSW and lead therapists.   Provided general education on TBI symptoms and management, as well as intrapersonal communication skills and coping strategies for stress management. Patient very appreciative after. Patient continued with intermittent language of confusion throughout discussion, but able to remain calm and receptive to education throughout.  Patient ambulated ~80 feet back to her room without complaint of symptoms with CGA-min A, as patient wanted to hold on to therapist for balance throughout. Provided cues for increased BOS due to intermittent scissoring gait and looking ahead to improve balance and visual scanning. She ambulated to the  bathroom and was continent of bladder, recalled need for use of urinary hat to obtain a urine sample without cues. Performed peri-care, lower body clothing management, and hand-hygiene with supervision for safety/balance.   Patient in bed with her  mother at bedside at end of session with breaks locked, bed alarm set, and all needs within reach.   Session 3: Patient sitting up in the bed coloring upon PT arrival. Patient alert and agreeable to PT session. Patient denied pain during session. Patient reported feeling happy this session. Stated she just finished a good movie, able to provide general plot line of the movie accurately, but unable to recall the title.   Therapeutic Activity: Transfers: Patient performed sit to/from stand with mod I throughout session. Patient safely waited for therapist to be within-arms-reach prior to standing for safety without cues.  Gait Training:  Patient ambulated >250 feet x2 with min A with patient's arm around therapist's waist for support, patient preferred this technique due to fear of falling. Noted mild imbalance x3 requiring min A to correct. On second trial, patient ambulated with CGA-close supervision without need of assistance and no LOB, cued patient to turn trunk when looking around to scn her environment to maintain cervical precautions, increased B arm swing and gait speed for improved balance, and increase BOS x1. Patient able to path find from the ortho gym back to her room without cues.  Neuromuscular Re-ed: Patient performed the following dynamic gait activities: -weaving through 8 cones 1.5' apart:  trial 1: patient with poor recall of activity, as she had stated she had done this before prior to PT demonstration, patient ambulated with wide BOS on either side of the cones rather than weaving through trial 2: patient followed PT demonstration hitting 3 cones with poor awareness until seeing the cones after completing the trial  trial 3: patient performed with CGA not hitting any cones -Stepping over cones set 1.5' apart with reciprocal gait pattern with CGA without LOB and good clearance following PT demonstration -backwards walking 2x20 feet with CGA, good gait speed and without deviation  from path -side-stepping 20 ft x2 R/L with CGA and increased step width with cues throughout, patient reported increased challenge -grapevine 20 feet R/L with CGA, consistent recall of pattern without error throughout  Educated patient on preset injuries, as patient recalled education from earlier and stated this was helpful. Also, provided education on deficits of safety awareness, emotional lability, problem solving, and balance. Discussed need for 24/7 supervision assist at d/c due to deficits, inability to drive safely, and increased time for recovery before returning to work and social activities. Patient receptive and stated understanding to all education. Will need reinforcement due to decreased recall of new information.    Patient sitting in the bed with her mom in the room at end of session with breaks locked, bed alarm set, and all needs within reach.   Therapy Documentation Precautions:  Precautions Precautions: Fall,Cervical Precaution Comments: C collar Required Braces or Orthoses: Cervical Brace,Splint/Cast Cervical Brace: Hard collar,At all times Splint/Cast: L wrist support Restrictions Weight Bearing Restrictions: Yes LUE Weight Bearing: Weight bearing as tolerated  Therapy/Group: Individual Therapy  Denise [name] L Denise [name] PT, DPT  01/08/2021, 12:55 PM

## 2021-01-09 ENCOUNTER — Inpatient Hospital Stay (HOSPITAL_COMMUNITY): Payer: 59

## 2021-01-09 ENCOUNTER — Inpatient Hospital Stay (HOSPITAL_COMMUNITY): Payer: 59 | Admitting: Occupational Therapy

## 2021-01-09 ENCOUNTER — Inpatient Hospital Stay (HOSPITAL_COMMUNITY): Payer: 59 | Admitting: Speech Pathology
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
## 2021-01-09 NOTE — Progress Notes (Signed)
Scalp laceration with significant eschar and scabbing.  Some blood purulent oozing noted at the area just anterior to her ear.  However, due to the significant scabbing the wound itself is unable to be adequately evaluated.  Her hair is matted to her wound as well.  Have discussed with nursing to try multiple different ways to clean this so we can get a better look.  Will return tomorrow after attempted cleaning today.  Letha Cape 1:58 PM 01/09/2021

## 2021-01-09 NOTE — Progress Notes (Signed)
Physical Therapy Session Note  Patient Details  Name: Denise [name] MRN: [id number] Date of Birth: 1996/02/14  Today's Date: 01/09/2021 PT Individual Time: 1445-1510 PT Individual Time Calculation (min): 25 min   Short Term Goals: Week 1:  PT Short Term Goal 1 (Week 1): Pt will ambulate >372ft w/supervision on level surfaces PT Short Term Goal 2 (Week 1): Gait >159ft on uneve/outdoor surfaces w/cga PT Short Term Goal 3 (Week 1): ascend/descend 12 stairs w/1 rail and cga PT Short Term Goal 4 (Week 1): Pt will tolerate BERG or other appropriate balance assessment PT Short Term Goal 5 (Week 1): Pt will recall directions from main treatment gym used in her care to her room on unit without cues required  Skilled Therapeutic Interventions/Progress Updates:    Pt received long sitting in bed, drawing in coloring book, agreeable to therapy services. No reports of pain. She reports frustration regarding scalp lacerations and the healing process. She also became quite tearful when she said that staff would have to cut her hair. Provided therapeutic calming and redirection. She also was upset with her prior OT session and how she became sick.. It took a bit of effort to engage her in OOB mobility but ultimately agreeable.   Supine<>sit indep at EOB. She donned tennis shoes with setupA while seated EOB. Ambulated with CGA from her room outside to nurses station. x1 LOB to the L while turning 90deg to the L, requiring minA for correction. Pt draped her arm around therapists shldr for stability and ambulated outside double doors.   RN notifying therapist of recent finding that family has tested + for COVID. (pt was masked when outside of room). Therefore, returned to her room with CGA (in similar fashion as written above) and returned to her bed. RN requesting that pt not be aware of change in precaution status until results are known. Pt missed 20 minutes of skilled therapy until airborne precautions are  known. Pt remained long sitting in bed at end of session with bed alarm on and her needs within reach.  Therapy Documentation Precautions:  Precautions Precautions: Fall,Cervical Precaution Comments: C collar Required Braces or Orthoses: Cervical Brace,Splint/Cast Cervical Brace: Hard collar,At all times Splint/Cast: L wrist support Restrictions Weight Bearing Restrictions: Yes LUE Weight Bearing: Weight bearing as tolerated General: PT Amount of Missed Time (min): 20 Minutes PT Missed Treatment Reason: Unavailable (Comment) (Abrupt end to session for need of airborne precautions)  Therapy/Group: Individual Therapy  Mckinnley Smithey P Reganne Messerschmidt PT 01/09/2021, 3:20 PM

## 2021-01-09 NOTE — Progress Notes (Signed)
Pt eschar and scabbing cleaned. No complications noted. Laceration visible through matted hair.  Denise [name]

## 2021-01-09 NOTE — Progress Notes (Signed)
Occupational Therapy Session Note  Patient Details  Name: Denise [name] MRN: [id number] Date of Birth: 02/05/1996  Today's Date: 01/09/2021 OT Individual Time: 0900-1000 OT Individual Time Calculation (min): 60 min    Short Term Goals: Week 1:  OT Short Term Goal 1 (Week 1): Pt will be able to don a shirt over cervical collar with supervision. OT Short Term Goal 2 (Week 1): Pt will be able to don underwear and pants with CGA. OT Short Term Goal 3 (Week 1): Pt will be able to complete toileting with CGA. OT Short Term Goal 4 (Week 1): Pt will demonstrate improved attention span by completing grooming with min cues.  Skilled Therapeutic Interventions/Progress Updates:    Pt received in bed ready for a shower. Pt asking about cleaning blood off her head but MD advises to wait. Pt was very relaxed today and did not express any anxiety.  She was more focused will minimal attention problems.  She needed CGA overall with self care and mobility. See ADL documentation below.  Pt ambulated bed to toilet, then out to room to pick out clothing and find new body wash.  Ambulated to shower.  In shower did help to get off one section of dried blood on her cheek but she did say the whole area was sensitive.  Pt bathed self and then suddenly got nauseated, jumping up to walk to toilet. Pt stood and vomited in toilet and then returned to shower. She stated she was feeling better. Pt then worked on shaving her underarms and legs.   Pt dried off and returned to bed so therapist could change her c collar pads out. Pt then dressed and opted to rest in bed as she started to get a headache.   Bed alarm set and all needs met.  Therapy Documentation Precautions:  Precautions Precautions: Fall,Cervical Precaution Comments: C collar Required Braces or Orthoses: Cervical Brace,Splint/Cast Cervical Brace: Hard collar,At all times Splint/Cast: L wrist support Restrictions Weight Bearing Restrictions: Yes LUE  Weight Bearing: Weight bearing as tolerated      Pain: Pain Assessment Pain Scale: 0-10 Pain Score: 3  Pain Descriptors / Indicators: Headache Pain Intervention(s): Medication (See eMAR) (Oxy given per pt request.) ADL: ADL Eating: Set up Grooming: Setup Upper Body Bathing: Setup Where Assessed-Upper Body Bathing: Shower Lower Body Bathing: Contact guard Where Assessed-Lower Body Bathing: Shower Upper Body Dressing: Supervision/safety Where Assessed-Upper Body Dressing: Edge of bed Lower Body Dressing: Minimal assistance Where Assessed-Lower Body Dressing: Edge of bed Toileting: Minimal assistance Where Assessed-Toileting: Glass blower/designer: Therapist, music Method: Magazine features editor: Curator Method: Heritage manager: Transfer tub bench,Grab bars   Therapy/Group: Individual Therapy  [place] 01/09/2021, 12:22 PM

## 2021-01-09 NOTE — Progress Notes (Signed)
Head laceration cleansed with peroxide and dressed with gauze. No complications noted. Scant sanguinous drainage noted.  Mylo Red, LPN

## 2021-01-09 NOTE — Progress Notes (Signed)
Physical Therapy Weekly Progress Note  Patient Details  Name: JAELYN BOURGOIN MRN: [id number] Date of Birth: 05/20/1996  Beginning of progress report period: January 02, 2021 End of progress report period: December 09, 2021  Today's Date: 01/09/2021 PT Individual Time: 1107-1201 PT Individual Time Calculation (min): 54 min   Patient has met 2 of 5 short term goals.  Pt is progressing well toward mobility goals, but has not met 3 out of 5 short term goals. Pt still requires CGA for ambulation secondary to intermittent unsteadiness and safety awareness deficits, though has demonstrated improvement in both. Pt has not attempted ambulation outdoors due to cold weather, so goal regarding gait outdoors has not been addressed. Pt has improved independence with bed mobility, transfers, and ambulation, and will continue to benefit from skilled PT with focus on increasing safety awareness, balance, multitasking ability, and DC prep.   Patient continues to demonstrate the following deficits muscle weakness, decreased cardiorespiratoy endurance, decreased problem solving, decreased safety awareness and decreased memory and decreased standing balance and decreased balance strategies and therefore will continue to benefit from skilled PT intervention to increase functional independence with mobility.  Patient progressing toward long term goals..  Continue plan of care.  PT Short Term Goals Week 1:  PT Short Term Goal 1 (Week 1): Pt will ambulate >343f w/supervision on level surfaces PT Short Term Goal 1 - Progress (Week 1): Progressing toward goal PT Short Term Goal 2 (Week 1): Gait >1512fon uneve/outdoor surfaces w/cga PT Short Term Goal 2 - Progress (Week 1): Progressing toward goal PT Short Term Goal 3 (Week 1): ascend/descend 12 stairs w/1 rail and cga PT Short Term Goal 3 - Progress (Week 1): Progressing toward goal PT Short Term Goal 4 (Week 1): Pt will tolerate BERG or other appropriate balance  assessment PT Short Term Goal 4 - Progress (Week 1): Met PT Short Term Goal 5 (Week 1): Pt will recall directions from main treatment gym used in her care to her room on unit without cues required PT Short Term Goal 5 - Progress (Week 1): Met Week 2:  PT Short Term Goal 1 (Week 2): STGs=LTGs  Skilled Therapeutic Interventions/Progress Updates:     Pt received supine in bed and agrees to therapy. Supine to sit with mod(I) and use of bed features. Pt dons shoes at EOB with set-up assistance. Pt ambulates x100' to dayroom with CGA. Pt then performs standing balance and activity tolerance activity with CGA from PT. Pt stands on airex mat to challenge somatosensory input, while performing cognitive task of identifying subject matter of photographs and creating a multi-sentence explanation of photograph contents. Pt requires multiple seated rest breaks during activity, with complaint of generalized fatigue, including headache. Number not provided. PT also discusses discharge with pt and strategies for safe mobility following DC from rehab.   Pt ambulates 250' with CGA and cues for upright gaze to improve posture and balance. Following seated rest break, pt tasked with ambulating back to room without cues on directions. Pt is able to complete, x250' with CGA and no cues necessary to find room. Pt left supine in bed with alarm intact and all needs within reach.  Therapy Documentation Precautions:  Precautions Precautions: Fall,Cervical Precaution Comments: C collar Required Braces or Orthoses: Cervical Brace,Splint/Cast Cervical Brace: Hard collar,At all times Splint/Cast: L wrist support Restrictions Weight Bearing Restrictions: Yes LUE Weight Bearing: Weight bearing as tolerated   Therapy/Group: Individual Therapy  WiBreck CoonsPT, DPT 01/09/2021, 3:56 PM

## 2021-01-09 NOTE — Progress Notes (Signed)
Darta,RN called IP on-call with the following information: patient had 2 visitors on 1.16.22.  Visitors called today and stated both had tested positive for COVID today.  Neither visitors worn their mask consistently; patient was unmasked.  RN requested advice on patient follow-up for COVID precautions. IP contacted Varney Daily, IP Director who advised the following:  Visitors not able to visit until complete minimum 10 days quarantine per visitation guidelines.  Patient to be considered PUI - follow appropraite hospital guidelines including 1. Keep patient's room door closed.  2. Initiate airborne/contact precautions for COVID PUI. 3. Dr. Drue Second, ID, will write orders for patient COVID testing.

## 2021-01-09 NOTE — Progress Notes (Signed)
Speech Language Pathology Weekly Progress and Session Note  Patient Details  Name: Denise [name] MRN: [id number] Date of Birth: [birth year]  Beginning of progress report period: January 02, 2021 End of progress report period: January 09, 2021  Today's Date: 01/09/2021 SLP Individual Time: 2774-1287 SLP Individual Time Calculation (min): 25 min  Short Term Goals: Week 1: SLP Short Term Goal 1 (Week 1): Patient will demonstrate functional problem solving for mildly complex tasks with Min verbal cues. SLP Short Term Goal 1 - Progress (Week 1): Met SLP Short Term Goal 2 (Week 1): Patient will recall new, daily information with Min verbal cues. SLP Short Term Goal 2 - Progress (Week 1): Met SLP Short Term Goal 3 (Week 1): Patient will demonstrate selective attention in a mildly distracting enviornment for ~45 minutes with Min verbal cues for redirection. SLP Short Term Goal 3 - Progress (Week 1): Met SLP Short Term Goal 4 (Week 1): Patient will self-monitor and correct errors during functional tasks with Min verbal cues. SLP Short Term Goal 4 - Progress (Week 1): Met    New Short Term Goals: Week 2: SLP Short Term Goal 1 (Week 2): STGs=LTGs due to ELOS  Weekly Progress Updates: Patient has made excellent gains and has met 4 of 4 STGs this reporting period. Currently, patient demonstrates behaviors consistent with a [place] Level VII and requires overall Min A verbal cues to complete functional and mildly complex tasks safely in regards to problem solving, recall, attention and awareness. Patient continues to demonstrate intermittent agitation that appears to be mostly related to interactions with family. Patient and family education ongoing. Patient would benefit from continued skilled SLP intervention to maximize her cognitive functioning and overall functional independence prior to discharge.     Intensity: Minumum of 1-2 x/day, 30 to 90 minutes Frequency: 3 to 5 out of 7  days Duration/Length of Stay: 01/13/21 Treatment/Interventions: Cognitive remediation/compensation;Internal/external aids;Therapeutic Activities;Environmental controls;Cueing hierarchy;Functional tasks;Patient/family education   Daily Session  Skilled Therapeutic Interventions: Skilled treatment session focused on cognitive goals. SLP facilitated session with a functional conversation that focused on generating a plan for how to handle anger and frustration upon discharge while at home with her mother and grandmother. Patient became tearful and reported she knows it is currently unsafe to drive away and reported she may be able to go on a walk but was fearful her family would follow her or call the police. Provided education on why that plan would also be unsafe. Patient verbalized understanding but needs to find an outlet for anger and frustration while at home. Ongoing education is needed. Patient left with RN. Continue with current plan of care.      Pain No/Denies Pain   Therapy/Group: Individual Therapy  Aries Townley 01/09/2021, 7:50 AM

## 2021-01-10 ENCOUNTER — Encounter (HOSPITAL_COMMUNITY): Payer: 59 | Admitting: Psychology

## 2021-01-10 ENCOUNTER — Inpatient Hospital Stay (HOSPITAL_COMMUNITY): Payer: 59

## 2021-01-10 LAB — BASIC METABOLIC PANEL
Anion gap: 12 (ref 5–15)
BUN: 12 mg/dL (ref 6–20)
CO2: 22 mmol/L (ref 22–32)
Calcium: 9 mg/dL (ref 8.9–10.3)
Chloride: 102 mmol/L (ref 98–111)
Creatinine, Ser: 0.61 mg/dL (ref 0.44–1.00)
GFR, Estimated: >60 mL/min (ref >60)
Glucose, Bld: 91 mg/dL (ref 70–99)
Potassium: 3.9 mmol/L (ref 3.5–5.1)
Sodium: 136 mmol/L (ref 135–145)

## 2021-01-10 LAB — OSMOLALITY, URINE: Osmolality, Ur: 348 mOsm/kg (ref 300–900)

## 2021-01-10 LAB — OSMOLALITY: Osmolality: 288 mOsm/kg (ref 275–295)

## 2021-01-10 LAB — ANTI-MYELIN ASSOC GLYCOP IGG: Anti-Myelin Assoc Glycop IgG: <1:10 {titer}

## 2021-01-10 NOTE — Progress Notes (Signed)
Occupational Therapy Session Note  Patient Details  Name: Denise [name] MRN: [id number] Date of Birth: 1996/04/16  Today's Date: 01/10/2021 OT Individual Time: 0905-1000 OT Individual Time Calculation (min): 55 min    Short Term Goals: Week 1:  OT Short Term Goal 1 (Week 1): Pt will be able to don a shirt over cervical collar with supervision. OT Short Term Goal 2 (Week 1): Pt will be able to don underwear and pants with CGA. OT Short Term Goal 3 (Week 1): Pt will be able to complete toileting with CGA. OT Short Term Goal 4 (Week 1): Pt will demonstrate improved attention span by completing grooming with min cues.  Skilled Therapeutic Interventions/Progress Updates:    Pt received supine with no c/o pain, requesting to use the bathroom. Pt completed all transfers at [facility]- supervision level overall during session. She had good overall safety awareness. Pt completed toileting tasks at supervision level. She was agreeable to shower and required min cueing for sitting down to doff clothing. She bathed seated and shaved legs/underarms with supervision- good use of grab bar as needed and TTB. She returned to EOB and donned LB clothing with CGA. Trauma PA entered room to examine head wound. Pt was quite emotional re having to cut her hair and emotional support was provided. She transferred to supine in bed for total A of changing out the pads on her c-collar. Cueing required to remain supine during collar change and for log rolling technique. Pt used the bathroom again, this time with continent BM void. Pt returned to sitting EOB in the care of the trauma PA.   Therapy Documentation Precautions:  Precautions Precautions: Fall,Cervical Precaution Comments: C collar Required Braces or Orthoses: Cervical Brace,Splint/Cast Cervical Brace: Hard collar,At all times Splint/Cast: L wrist support Restrictions Weight Bearing Restrictions: Yes LUE Weight Bearing: Weight bearing as  tolerated   Therapy/Group: Individual Therapy  Crissie Reese 01/10/2021, 12:37 PM

## 2021-01-10 NOTE — Progress Notes (Signed)
Sleep Chart Update: Pt slept from 0000am -0630am with minimal interruptions. RN will get urine osmolality once pt void. Pt denies pain and in no acute distress. RN will continue to monitor

## 2021-01-10 NOTE — Progress Notes (Signed)
Physical Therapy Session Note  Patient Details  Name: RIAH KEHOE MRN: [id number] Date of Birth: 1996-08-25  Today's Date: 01/10/2021 PT Individual Time: 1331-1434 PT Individual Time Calculation (min): 63 min   Short Term Goals: Week 2:  PT Short Term Goal 1 (Week 2): STGs=LTGs  Skilled Therapeutic Interventions/Progress Updates:     Pt received supine in bed asleep. Easily roused and agreeable to therapy. Reports some pain in neck and back. Number not provided. PT provides rest breaks as needed to manage pain. Supine to sit mod(I) with use of bed features. PT discusses discharge plan with pt and provides strategies for safe recovery following DC. Pt verbalizes understanding. Pt performs BERG balance assessment as detailed below. Following, pt ambulates to from toilet with close supervision for safety. Has slight LOB during return form toilet but is able to correct balance without physical assistance. Pt then participates in balance/ambulation challenge, navigating obstacles in room positioned by PT to simulate household ambulation. Pt able to walk smoothly around obstacles when focused on single task. When give multitasking challenge of naming supermarket items with each letter of the alphabet, however, pt takes significantly longer to ambulate through room, pausing to stop and think prior to each response. PT educates on safety with mobility and importance of pt focusing on primary tasks following DC. Pt left supine in bed with alarm intact and all needs within reach.  Therapy Documentation Precautions:  Precautions Precautions: Fall,Cervical Precaution Comments: C collar Required Braces or Orthoses: Cervical Brace,Splint/Cast Cervical Brace: Hard collar,At all times Splint/Cast: L wrist support Restrictions Weight Bearing Restrictions: Yes LUE Weight Bearing: Weight bearing as tolerated Balance: Balance Balance Assessed: Yes Standardized Balance Assessment Standardized Balance  Assessment: Berg Balance Test Berg Balance Test Sit to Stand: Able to stand without using hands and stabilize independently Standing Unsupported: Able to stand safely 2 minutes Sitting with Back Unsupported but Feet Supported on Floor or Stool: Able to sit safely and securely 2 minutes Stand to Sit: Sits safely with minimal use of hands Transfers: Able to transfer safely, minor use of hands Standing Unsupported with Eyes Closed: Able to stand 10 seconds safely Standing Ubsupported with Feet Together: Able to place feet together independently and stand 1 minute safely From Standing, Reach Forward with Outstretched Arm: Can reach forward >12 cm safely (5") From Standing Position, Pick up Object from Floor: Able to pick up shoe, needs supervision From Standing Position, Turn to Look Behind Over each Shoulder: Turn sideways only but maintains balance Turn 360 Degrees: Able to turn 360 degrees safely but slowly Standing Unsupported, Alternately Place Feet on Step/Stool: Able to stand independently and safely and complete 8 steps in 20 seconds Standing Unsupported, One Foot in Front: Able to plae foot ahead of the other independently and hold 30 seconds Standing on One Leg: Able to lift leg independently and hold 5-10 seconds Total Score: 48   Therapy/Group: Individual Therapy  Beau Fanny, PT, DPT 01/10/2021, 3:06 PM

## 2021-01-10 NOTE — Patient Care Conference (Signed)
Inpatient RehabilitationTeam Conference and Plan of Care Update Date: 01/10/2021   Time: 10:50 AM    Patient Name: Denise [name]      Medical Record Number: [id number]  Date of Birth: [birth year] Sex: Female         Room/Bed: 4W13C/4W13C-01 Payor Info: Payor: [employer]  / Plan: [employer] / Product Type: *No Product type* /    Admit Date/Time:  01/01/2021  2:29 PM  Primary Diagnosis:  TBI (traumatic brain injury) [facility])  Hospital Problems: Principal Problem:   TBI (traumatic brain injury) [facility])    Expected Discharge Date: Expected Discharge Date: 01/13/21 (Depends on Covid status)  Team Members Present: Physician leading conference: Dr. Faith Rogue Care Coodinator Present: Cecile Sheerer, LCSWA;Stacey Marlyne Beards, RN, BSN, CRRN Nurse Present: Adam Phenix, LPN PT Present: Malachi Pro, PT OT Present: Towanda Malkin, OT SLP Present: Suzzette Righter, CF-SLP PPS Coordinator present : Edson Snowball, Park Breed, SLP     Current Status/Progress Goal Weekly Team Focus  Bowel/Bladder   pt continent B/B LBM 1/17  Pt will remain continent throughout stay  q2 toilet regimen, daily stool softner   Swallow/Nutrition/ Hydration             ADL's   CGA overall with self care and transfers, much calmer, attention span and focus has improved a great deal  supervision overall  ADL training, cognitive training, pt/family education, balance   Mobility   mod(I) bed mobility, transfers supervision to CGA, gait primarily CGA and occasional minA>300'.  supervision  high level balance, cognition, multitasking, DC prep   Communication             Safety/Cognition/ Behavioral Observations  Min A  Supervision  complex problem solving, recall, attention and awareness   Pain   Pt denies pain tonight  Pt will alert staff if pain develops  assess pain qshift, PRN pain meds   Skin   Scalp Laceration on left side, bruises on arms bilaterally  Trauma team will develop a plan to heal wound once  they can assess wound.  educate pt on prevention of skin breakdown.     Discharge Planning:  Pt to d/c to home with her mother and grandmother who will provide 24/7care to patient.   Team Discussion: Trauma team came today and had to cut some of the patient's hair to expose the wound to the scalp to promote proper healing. Some hair had began to grow into the wound and could cause infection to set in. This was very traumatizing for the patient. Patient's mother and grandmother have both tested positive for Covid and have been advised they can't come back for a minimum of 10 days per hospital policy. Patient is now on Airborne precautions but is not yet showing s/s. IP is aware and discharge date is dependent on Covid status. Patient on target to meet rehab goals: Patient is contact guard for self-care and mobility. She is contact guard overall. She is always calmer when family is not around. She is on track for discharge. SLP is working on memory, agitation strategies and agitation strategies with family.  *See Care Plan and progress notes for long and short-term goals.   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  Teaching Needs: Family education, wound/skin care education, medication management.  Current Barriers to Discharge: Inaccessible home environment, Decreased caregiver support, Home enviroment access/layout, Wound care, Lack of/limited family support, Medication compliance and Behavior  Possible Resolutions to Barriers: Continue  current medications, provide emotional support to patient and family.     Medical Summary Current Status: improved pain control. emotionally still labile but improving. wound still draining, being followed by surgery. DI showing signs of improvement  Barriers to Discharge: Medical stability   Possible Resolutions to Barriers/Weekly Focus: wound care, surgical f/u. optimize sleep an pain control, daily review of  labs and patient data   Continued Need for Acute Rehabilitation Level of Care: The patient requires daily medical management by a physician with specialized training in physical medicine and rehabilitation for the following reasons: Direction of a multidisciplinary physical rehabilitation program to maximize functional independence : Yes Medical management of patient stability for increased activity during participation in an intensive rehabilitation regime.: Yes Analysis of laboratory values and/or radiology reports with any subsequent need for medication adjustment and/or medical intervention. : Yes   I attest that I was present, lead the team conference, and concur with the assessment and plan of the team.   Tennis Must 01/10/2021, 5:19 PM

## 2021-01-10 NOTE — Progress Notes (Signed)
Occupational Therapy Weekly Progress Note  Patient Details  Name: Denise [name] MRN: [id number] Date of Birth: 12-28-1995  Beginning of progress report period: January 02, 2021 End of progress report period: January 10, 2021   Patient has met 4 of 4 short term goals.  Denise [name] has made great progress this reporting period. She is able to complete all ADLs at a supervision- CGA level with improvements in awareness and attention. She is demonstrating behaviors consistent with a RLAS VII, despite angry outbursts that occur with her mother. She is able to consistently participate fully with therapy and is motivated to continue progressing.   Patient continues to demonstrate the following deficits: muscle weakness, decreased attention, decreased awareness, decreased problem solving, decreased safety awareness, decreased memory and delayed processing and decreased standing balance, decreased postural control and decreased balance strategies and therefore will continue to benefit from skilled OT intervention to enhance overall performance with BADL, iADL and Vocation.  Patient progressing toward long term goals..  Continue plan of care.  OT Short Term Goals Week 1:  OT Short Term Goal 1 (Week 1): Pt will be able to don a shirt over cervical collar with supervision. OT Short Term Goal 1 - Progress (Week 1): Met OT Short Term Goal 2 (Week 1): Pt will be able to don underwear and pants with CGA. OT Short Term Goal 2 - Progress (Week 1): Met OT Short Term Goal 3 (Week 1): Pt will be able to complete toileting with CGA. OT Short Term Goal 3 - Progress (Week 1): Met OT Short Term Goal 4 (Week 1): Pt will demonstrate improved attention span by completing grooming with min cues. OT Short Term Goal 4 - Progress (Week 1): Met Week 2:  OT Short Term Goal 1 (Week 2): STG= LTG d/t ELOS   Curtis Sites 01/10/2021, 12:50 PM

## 2021-01-10 NOTE — Progress Notes (Signed)
Subjective: PUI now.  Nursing did a great job getting her wound much cleaner yesterday that it ever has been.    ROS: See above, otherwise other systems negative  Objective: Vital signs in last 24 hours: Temp:  [97.9 F (36.6 C)-98.1 F (36.7 C)] 97.9 F (36.6 C) (01/18 0541) Pulse Rate:  [80-96] 80 (01/18 0541) Resp:  [16-18] 16 (01/18 0541) BP: (91-110)/(62-74) 91/62 (01/18 0541) SpO2:  [100 %] 100 % (01/18 0541) Last BM Date: 01/09/21  Intake/Output from previous day: 01/17 0701 - 01/18 0700 In: 497 [P.O.:497] Out: 300 [Urine:300] Intake/Output this shift: No intake/output data recorded.  PE: Head: scalp wound is open, essentially the back 2/3 of the wound.  It does have an odor or pseudomonas, but given the matted hair with old blood and drainage it is difficult to tell if it may be from that.  The wound itself is actually fairly clean with pink tissue.  Unfortunately her hair has grown through her wound in some locations and likely preventing good healing.  Some of her hair was cut today around the wound to gain good exposure.  There was good bleeding to affirm viability of tissue.   NS WD dressing placed in wound upon departure.  Lab Results:  No results for input(s): WBC, HGB, HCT, PLT in the last 72 hours. BMET Recent Labs    01/10/21 0521  NA 136  K 3.9  CL 102  CO2 22  GLUCOSE 91  BUN 12  CREATININE 0.61  CALCIUM 9.0   PT/INR No results for input(s): LABPROT, INR in the last 72 hours. CMP     Component Value Date/Time   NA 136 01/10/2021 0521   K 3.9 01/10/2021 0521   CL 102 01/10/2021 0521   CO2 22 01/10/2021 0521   GLUCOSE 91 01/10/2021 0521   BUN 12 01/10/2021 0521   CREATININE 0.61 01/10/2021 0521   CALCIUM 9.0 01/10/2021 0521   PROT 7.9 01/06/2021 0541   ALBUMIN 3.3 (L) 01/06/2021 0541   AST 37 01/06/2021 0541   ALT 73 (H) 01/06/2021 0541   ALKPHOS 161 (H) 01/06/2021 0541   BILITOT 0.5 01/06/2021 0541   GFRNONAA >60 01/10/2021 0521    GFRAA >60 08/27/2017 2352   Lipase  No results found for: LIPASE     Studies/Results: No results found.  Anti-infectives: Anti-infectives (From admission, onward)   Start     Dose/Rate Route Frequency Ordered Stop   01/01/21 2200  ceFAZolin (ANCEF) IVPB 1 g/50 mL premix        1 g 100 mL/hr over 30 Minutes Intravenous Every 8 hours 01/01/21 1602 01/03/21 0719       Assessment/Plan Scalp laceration This wound has been cleaned fairly well.  The wound has dehisced and is open for the back 2/3rds of the wound.  It is overall clean.  No need for abx therapy at this time.  Will pack with NS WD dressing changes BID. If pseudomonas smell persists, can transition to Dakin's solution. Unfortunately some of her hair had grown into the wound and had to be cut.  It was also cut away from the wound so we could have good access in order to get this to start healing.  We will recheck wound in 1-2 days prior to discharge to assure it is continuing to look well.    LOS: 9 days    Letha Cape , [facility] Surgery 01/10/2021, 10:33 AM Please see Amion  for pager number during day hours 7:00am-4:30pm or 7:00am -11:30am on weekends
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
## 2021-01-10 NOTE — Progress Notes (Signed)
Patient ID: Denise [name], female   DOB: Jun 21, 1996, 25 y.o.   MRN: [id number]  SW met with pt in room while PT present to discuss her d/c location. Pt intends to d/c to home with her mother since she states this is the best place for her to go. SW updated pt on gains made while here in rehab. Pt aware SW to follow-up with her mother to discuss d/c plan.  *SW returned phone call to pt mother Benjamine Mola [phone]) to provide updates from team conference on gains pt has made, and informed there will be updates by Thursday to determine when pt should d/c to home. Pt mother is primarily concerned about if pt will still d/c to home with her since she has COVID. SW reiterated there will be updates from physician by Thursday on best plan of care.   Loralee Pacas, MSW, [place] Office: [phone] Cell: [phone] Fax: [phone]

## 2021-01-10 NOTE — Progress Notes (Signed)
Speech Language Pathology Daily Session Note  Patient Details  Name: Denise [name] MRN: [id number] Date of Birth: March 13, 1996  Today's Date: 01/10/2021 SLP Individual Time: 1140-1205 SLP Individual Time Calculation (min): 25 min  Short Term Goals: Week 2: SLP Short Term Goal 1 (Week 2): STGs=LTGs due to ELOS  Skilled Therapeutic Interventions: Skilled treatment session focused on cognitive goals, SLP donning PPE for contact and airborne precautions. Upon entry, pt on phone with mother crying and cursing before hanging up mid sentence. Pt emotionally labile throughout tx session re: having hair cut for wound care, mother cancelling apartment, discharge to mother's care and potentially having to stay extra days 2' family Covid status. SLP providing emotional support and strategies to navigate anger and frustration. Pt did demonstrate episodes of word finding difficulty during tx session, likely secondary to emotional state. SLP educating patient on strategies to increase word finding ability (synonym, salient features, etc). Pt continues to demonstrate decreased awareness of current cognitive limitations and implications for safety during daily tasks, ongoing education is needed. Pt left in bed with alarm on and all needs within reach. Cont ST POC.   Pain Pain Assessment Pain Scale: 0-10 Pain Score: 0-No pain  Therapy/Group: Individual Therapy  Tacey Ruiz 01/10/2021, 12:47 PM

## 2021-01-10 NOTE — Progress Notes (Signed)
Orthopedic Tech Progress Note Patient Details:  KARSON CHICAS 11/30/1996 [id number] Patient has brace Patient ID: Deborah Chalk, female   DOB: Jul 09, 1996, 25 y.o.   MRN: [id number]   Michelle Piper 01/10/2021, 11:21 AM

## 2021-01-11 ENCOUNTER — Inpatient Hospital Stay (HOSPITAL_COMMUNITY): Payer: 59
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
## 2021-01-11 NOTE — Progress Notes (Signed)
Sleep Chart Update: Pt slept throughout the night from 2300pm-0600am with minimal interruptions. Dx done. Pt tolerated dx change well. RN will continue to monitor.

## 2021-01-11 NOTE — Progress Notes (Signed)
Speech Language Pathology Daily Session Note  Patient Details  Name: JAMIEE MILHOLLAND MRN: [id number] Date of Birth: 01/21/1996  Today's Date: 01/11/2021 SLP Individual Time: 1010-1055 SLP Individual Time Calculation (min): 45 min  Short Term Goals: Week 2: SLP Short Term Goal 1 (Week 2): STGs=LTGs due to ELOS  Skilled Therapeutic Interventions: Skilled treatment session focused on cognitive goals, SLP donning PPE for contact and airborne precautions. Pt continues to express frustration with family, however much less emotional this date with increased sustained attention to task. Pt completing divided attention task with independent utilizing of compensatory strategies to increase accuracy, benefiting from Supervision A for 100% accuracy. SLP facilitating complex problem solving task for home environment providing Supervision A for safety and increasing awareness of potential hazards. Pt left in bed with alarm set and all needs within reach. Cont ST POC.   Pain Pain Assessment Pain Scale: 0-10 Pain Score: 0-No pain  Therapy/Group: Individual Therapy  Tacey Ruiz 01/11/2021, 10:49 AM

## 2021-01-11 NOTE — Progress Notes (Signed)
Occupational Therapy Session Note  Patient Details  Name: Denise [name] MRN: [id number] Date of Birth: 31-Jul-1996  Today's Date: 01/11/2021 OT Individual Time: 5400-8676 OT Individual Time Calculation (min): 45 min    Short Term Goals: Week 1:  OT Short Term Goal 1 (Week 1): Pt will be able to don a shirt over cervical collar with supervision. OT Short Term Goal 1 - Progress (Week 1): Met OT Short Term Goal 2 (Week 1): Pt will be able to don underwear and pants with CGA. OT Short Term Goal 2 - Progress (Week 1): Met OT Short Term Goal 3 (Week 1): Pt will be able to complete toileting with CGA. OT Short Term Goal 3 - Progress (Week 1): Met OT Short Term Goal 4 (Week 1): Pt will demonstrate improved attention span by completing grooming with min cues. OT Short Term Goal 4 - Progress (Week 1): Met  Skilled Therapeutic Interventions/Progress Updates:     Pt received in bed with no pain reported.RN delivers medicaiotns during session. Pt agreeable to ADL at shower level  ADL: Pt completes all transfers/BADLs at supervision level with increased time for thoroughness as well as VC for safety awareness. Pt with good carryover of standing at EOB waiting to check for symptoms of dizziness without cuing. Pt requires VC for doffing/donning LB clothing at seated level. Pt bathes with set up at sit to stand bathing chest down to not get collar pads wet. Pt completes oral care/face washing standing at sink with increased time d/t pt brushing teeth 2x (reports this is habit of thoroughness). Pt returned to bed at end of session.  Pt left at end of session in bed with exit alarm on, call light in reach and all needs met    Therapy Documentation Precautions:  Precautions Precautions: Fall,Cervical Precaution Comments: C collar Required Braces or Orthoses: Cervical Brace,Splint/Cast Cervical Brace: Hard collar,At all times Splint/Cast: L wrist support Restrictions Weight Bearing Restrictions:  Yes LUE Weight Bearing: Weight bearing as tolerated General:   Vital Signs: Therapy Vitals Temp: 98.1 F (36.7 C) Temp Source: Oral Pulse Rate: 93 Resp: 16 BP: 109/75 Patient Position (if appropriate): Sitting Oxygen Therapy SpO2: 99 % O2 Device: Room Air Pain: Pain Assessment Pain Scale: 0-10 Pain Score: 8  Pain Type: Acute pain Pain Location: Wrist Pain Orientation: Left Pain Descriptors / Indicators: Aching Pain Frequency: Intermittent Patients Stated Pain Goal: 2 Pain Intervention(s): Medication (See eMAR) ADL: ADL Eating: Set up Grooming: Setup Upper Body Bathing: Setup Where Assessed-Upper Body Bathing: Shower Lower Body Bathing: Contact guard Where Assessed-Lower Body Bathing: Shower Upper Body Dressing: Supervision/safety Where Assessed-Upper Body Dressing: Edge of bed Lower Body Dressing: Minimal assistance Where Assessed-Lower Body Dressing: Edge of bed Toileting: Minimal assistance Where Assessed-Toileting: Glass blower/designer: Therapist, music Method: Magazine features editor: Curator Method: Heritage manager: Research scientist (life sciences)   Exercises:   Other Treatments:     Therapy/Group: Individual Therapy  Tonny Branch 01/11/2021, 6:52 AM

## 2021-01-11 NOTE — Progress Notes (Signed)
Physical Therapy Session Note  Patient Details  Name: AELIANA SPATES MRN: [id number] Date of Birth: 1996-03-31  Today's Date: 01/11/2021 PT Individual Time: 3790-2409 PT Individual Time Calculation (min): 30 min   Short Term Goals: Week 2:  PT Short Term Goal 1 (Week 2): STGs=LTGs  Skilled Therapeutic Interventions/Progress Updates:     Pt received supine in bed asleep. Easily roused and agreeable to therapy. Reports some pain in back. Number not provided. PT provides rest breaks to manage pain. Supine<>sit during session independently. Pt performs sit to stand independently. Ambulates x20' with supervision with cues for safety. Pt stands in corner of room to perform single leg stance for NMR of balance and multitasking. Pt initially stands with close supervision up to 5 seconds at a time with alternating legs. After 5 seconds pt tends to lose balance but is able to support self with wall. Pt then given task of counting up by 3s while balancing. Pt has significant difficulty multitasking, not counting accurately and losing balance more frequently. Pt returns to bed for supine therex, performing bridges. 2x10 with both legs, then 2x10 single leg bridges with alternating legs. Pt give cognitive task of providing gendered name in sequence of alphabet while performing therex.   Pt left supine in bed with alarm intact and all needs within reach.  Therapy Documentation Precautions:  Precautions Precautions: Fall,Cervical Precaution Comments: C collar Required Braces or Orthoses: Cervical Brace,Splint/Cast Cervical Brace: Hard collar,At all times Splint/Cast: L wrist support Restrictions Weight Bearing Restrictions: Yes LUE Weight Bearing: Weight bearing as tolerated   Therapy/Group: Individual Therapy  Beau Fanny, PT, DPt 01/11/2021, 3:41 PM

## 2021-01-12 ENCOUNTER — Inpatient Hospital Stay (HOSPITAL_COMMUNITY): Payer: 59 | Admitting: Speech Pathology

## 2021-01-12 ENCOUNTER — Inpatient Hospital Stay (HOSPITAL_COMMUNITY): Payer: 59

## 2021-01-12 ENCOUNTER — Inpatient Hospital Stay (HOSPITAL_COMMUNITY): Payer: 59 | Admitting: Occupational Therapy

## 2021-01-12 LAB — SARS CORONAVIRUS 2 (TAT 6-24 HRS): SARS Coronavirus 2: NEGATIVE

## 2021-01-12 NOTE — Progress Notes (Signed)
Patient ID: Denise [name], female   DOB: [birth year], 24 y.o.   MRN: [id number]  SW received updates from attending pt will not d/c to home tomorrow as per ID pt family will need to test negative for COVID before pt can d/c to home.  *SW called pt mother Denise [name] [phone]) to discuss. She reports she tested positive as of yesterday.SW encouraged her to follow-up about when she is able to retest for COVID when she takes her mother today to be tested.  *SW met with pt in room to provide updates on above.   Loralee Pacas, MSW, [place] Office: [phone] Cell: [phone] Fax: [phone]

## 2021-01-12 NOTE — Progress Notes (Signed)
Physical Therapy Session Note  Patient Details  Name: Denise [name] MRN: [id number] Date of Birth: 1996-07-23  Today's Date: 01/12/2021 PT Individual Time: 1006-1059 PT Individual Time Calculation (min): 53 min   Short Term Goals: Week 2:  PT Short Term Goal 1 (Week 2): STGs=LTGs  Skilled Therapeutic Interventions/Progress Updates:     Pt received supine in bed and agrees to therapy. Bed mobility independent. No complaint of pain. Pt performs multiple reps of sit to stand independently during session. Session focused on mobilizing in room and performing dynamic balancing tasks, such as bending down to pick up trash and other objects on floor of room. Pt ambulates up to 200' at a time with supervision and verbal cues for trunk rotation and arm swing to improve balance. PT provides CGA for pt as she picks up objects off floor. Pt takes multiple seated rest breaks. Pt also makes up bed with verbal cues from PT. PT educates pt on safe mobility following discharge and recommendations for frequency of ambulation. Pt verbalizes understanding. Pt left supine in bed with alarm intact and all needs within reach.  Therapy Documentation Precautions:  Precautions Precautions: Fall,Cervical Precaution Comments: C collar Required Braces or Orthoses: Cervical Brace,Splint/Cast Cervical Brace: Hard collar,At all times Splint/Cast: L wrist support Restrictions Weight Bearing Restrictions: Yes LUE Weight Bearing: Weight bearing as tolerated   Therapy/Group: Individual Therapy  Beau Fanny, PT, DPT 01/12/2021, 4:40 PM

## 2021-01-12 NOTE — Progress Notes (Signed)
Occupational Therapy Session Note  Patient Details  Name: Denise [name] MRN: [id number] Date of Birth: [birth year]  Today's Date: 01/12/2021 OT Individual Time: 0835-0900 OT Individual Time Calculation (min): 25 min    Short Term Goals: Week 1:  OT Short Term Goal 1 (Week 1): Pt will be able to don a shirt over cervical collar with supervision. OT Short Term Goal 1 - Progress (Week 1): Met OT Short Term Goal 2 (Week 1): Pt will be able to don underwear and pants with CGA. OT Short Term Goal 2 - Progress (Week 1): Met OT Short Term Goal 3 (Week 1): Pt will be able to complete toileting with CGA. OT Short Term Goal 3 - Progress (Week 1): Met OT Short Term Goal 4 (Week 1): Pt will demonstrate improved attention span by completing grooming with min cues. OT Short Term Goal 4 - Progress (Week 1): Met  Skilled Therapeutic Interventions/Progress Updates:    Pt received in bed requesting to get up and use the bathroom. Good safety awareness in waiting for therapist to help her.  Pt able to get up and walk to the bathroom with S, use toilet and change pants with S.  Pt then ambulated back to sit at EOB and worked on light AROM UE exercises.  Pt continues to have rib pain and cervical precautions so used modified exercises. She worked on arm circles, gentle trunk rotation, alternating arm punches.  Pt tolerated exercises well. She opted to lay down in bed at end of session.  Spent time discussing her discharge plans and the current situation she is in.  Bed alarm set and all needs met.  Therapy Documentation Precautions:  Precautions Precautions: Fall,Cervical Precaution Comments: C collar Required Braces or Orthoses: Cervical Brace,Splint/Cast Cervical Brace: Hard collar,At all times Splint/Cast: L wrist support Restrictions Weight Bearing Restrictions: Yes LUE Weight Bearing: Weight bearing as tolerated       Pain: Pain Assessment Pain Score: 0-No pain ADL: ADL Eating: Set  up Grooming: Setup Upper Body Bathing: Setup Where Assessed-Upper Body Bathing: Shower Lower Body Bathing: Contact guard Where Assessed-Lower Body Bathing: Shower Upper Body Dressing: Supervision/safety Where Assessed-Upper Body Dressing: Edge of bed Lower Body Dressing: Minimal assistance Where Assessed-Lower Body Dressing: Edge of bed Toileting: Minimal assistance Where Assessed-Toileting: Glass blower/designer: Therapist, music Method: Magazine features editor: Curator Method: Heritage manager: Transfer tub bench,Grab bars   Therapy/Group: Individual Therapy  [place] 01/12/2021, 12:25 PM

## 2021-01-12 NOTE — Progress Notes (Signed)
       Subjective: Resting in bed.  No complaints.  ROS: See above, otherwise other systems negative  Objective: Vital signs in last 24 hours: Temp:  [98.1 F (36.7 C)] 98.1 F (36.7 C) (01/20 0616) Pulse Rate:  [76-95] 76 (01/20 0616) Resp:  [16-18] 16 (01/20 0616) BP: (88-110)/(52-69) 98/61 (01/20 0616) SpO2:  [100 %] 100 % (01/20 0616) Last BM Date: 01/11/20  Intake/Output from previous day: 01/19 0701 - 01/20 0700 In: 240 [P.O.:240] Out: -  Intake/Output this shift: No intake/output data recorded.  PE: Head: scalp wound is clean and dressed appropriately.  No infection or drainage noted.  Wound base is 100% beefy red granulation tissue.  Lab Results:  No results for input(s): WBC, HGB, HCT, PLT in the last 72 hours. BMET Recent Labs    01/10/21 0521  NA 136  K 3.9  CL 102  CO2 22  GLUCOSE 91  BUN 12  CREATININE 0.61  CALCIUM 9.0   PT/INR No results for input(s): LABPROT, INR in the last 72 hours. CMP     Component Value Date/Time   NA 136 01/10/2021 0521   K 3.9 01/10/2021 0521   CL 102 01/10/2021 0521   CO2 22 01/10/2021 0521   GLUCOSE 91 01/10/2021 0521   BUN 12 01/10/2021 0521   CREATININE 0.61 01/10/2021 0521   CALCIUM 9.0 01/10/2021 0521   PROT 7.9 01/06/2021 0541   ALBUMIN 3.3 (L) 01/06/2021 0541   AST 37 01/06/2021 0541   ALT 73 (H) 01/06/2021 0541   ALKPHOS 161 (H) 01/06/2021 0541   BILITOT 0.5 01/06/2021 0541   GFRNONAA >60 01/10/2021 0521   GFRAA >60 08/27/2017 2352   Lipase  No results found for: LIPASE     Studies/Results: No results found.  Anti-infectives: Anti-infectives (From admission, onward)   Start     Dose/Rate Route Frequency Ordered Stop   01/01/21 2200  ceFAZolin (ANCEF) IVPB 1 g/50 mL premix        1 g 100 mL/hr over 30 Minutes Intravenous Every 8 hours 01/01/21 1602 01/03/21 0719       Assessment/Plan Scalp laceration -wound looks nice and clean -cont WD dressing changes twice a day -may shower and  wash hair -will have her follow up in a couple of weeks in trauma clinic to check wound -otherwise call us if needed  -d/w Dr. Riley Kill   LOS: 11 days    Denise [name] , [facility] Surgery 01/12/2021, 8:15 AM Please see Amion for pager number during day hours 7:00am-4:30pm or 7:00am -11:30am on weekends
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
## 2021-01-12 NOTE — Progress Notes (Signed)
Speech Language Pathology Daily Session Note  Patient Details  Name: Denise [name] MRN: [id number] Date of Birth: [birth year]  Today's Date: 01/12/2021 SLP Individual Time: 1400-1440 SLP Individual Time Calculation (min): 40 min  Short Term Goals: Week 2: SLP Short Term Goal 1 (Week 2): STGs=LTGs due to ELOS  Skilled Therapeutic Interventions: Skilled treatment session focused on cognitive goals. Upon arrival, patient was tearful due to having to stay here longer. However, with extra time and encouragement patient was able to calm down and focus on goals of SLP session. SLP facilitated session by providing extra time and overall supervision level verbal cues for problem solving during a complex deductive reasoning task. Patient asked to complete another one for "homework" in order to help keep her busy and distracted while in the room by herself. Patient left upright in bed with alarm on and all needs within reach. Continue with current plan of care.      Pain Pain Assessment Pain Score: 0-No pain  Therapy/Group: Individual Therapy  Chistine Dematteo 01/12/2021, 3:58 PM

## 2021-01-13 ENCOUNTER — Inpatient Hospital Stay (HOSPITAL_COMMUNITY): Payer: 59 | Admitting: Occupational Therapy

## 2021-01-13 ENCOUNTER — Inpatient Hospital Stay (HOSPITAL_COMMUNITY): Payer: 59

## 2021-01-13 ENCOUNTER — Inpatient Hospital Stay (HOSPITAL_COMMUNITY): Payer: 59 | Admitting: Speech Pathology
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
## 2021-01-13 NOTE — Progress Notes (Signed)
Physical Therapy Session Note  Patient Details  Name: Denise [name] MRN: [id number] Date of Birth: 02/13/1996  Today's Date: 01/13/2021 PT Individual Time: 1005-1059 PT Individual Time Calculation (min): 54 min   Short Term Goals: Week 2:  PT Short Term Goal 1 (Week 2): STGs=LTGs  Skilled Therapeutic Interventions/Progress Updates:     Pt received supine in bed and agrees to therapy. No complaint of pain. Bed mobility independent. Pt performs sit to stand multiple times throughout session independently. Session focused on ambulation for endurance as well as dynamic balance challenge and community reintegration. Pt ambulates >500' with CGA. Pt manages elevator to travel downstairs with CGA and cues for safety. Pt then ambulates outside with occasional minA when ambulating over unlevel ground or when pt becomes distracted and catches toe during swing phase. PT provides education on increased safety with mobility. Pt ambulates up 15 steps with RHR and CGA. Following extended seated rest break due to fatigue, pt performs NMR for standing balance, tossing ball at trampoline and catching rebound while standing on airex mat to provide unreliable somatosensory input. Pt able to completes with bilatreal lower extremity support with CGA x20 reps. Performs with single leg stance with each foot 1x20 with minA/modA. Pt ambulates back to room with CGA. Left supine in bed with alarm intact and all needs within reach.  Therapy Documentation Precautions:  Precautions Precautions: Fall,Cervical Precaution Comments: C collar Required Braces or Orthoses: Cervical Brace,Splint/Cast Cervical Brace: Hard collar,At all times Splint/Cast: L wrist support Restrictions Weight Bearing Restrictions: Yes LUE Weight Bearing: Weight bearing as tolerated    Therapy/Group: Individual Therapy  Beau Fanny, PT, DPT 01/13/2021, 4:36 PM

## 2021-01-13 NOTE — Progress Notes (Signed)
Speech Language Pathology Daily Session Note  Patient Details  Name: AIRIEL OBLINGER MRN: [id number] Date of Birth: 1996/10/27  Today's Date: 01/13/2021 SLP Individual Time: 1415-1450 SLP Individual Time Calculation (min): 35 min  Short Term Goals: Week 2: SLP Short Term Goal 1 (Week 2): STGs=LTGs due to ELOS  Skilled Therapeutic Interventions: Skilled treatment session focused on cognitive goals. SLP facilitated session by providing supervision verbal cues for patient to self-monitor and correct errors during a complex deductive reasoning task. However, patient was overall Mod I for recall of complex information while dividing her attention with another task. Patient left upright in bed with alarm on and all needs within reach. Continue with current plan of care.      Pain No/Denies Pain   Therapy/Group: Individual Therapy  Hamsa Laurich 01/13/2021, 3:36 PM

## 2021-01-13 NOTE — Plan of Care (Signed)
  Problem: Consults Goal: RH BRAIN INJURY PATIENT EDUCATION Description: Description: See Patient Education module for eduction specifics Outcome: Progressing   Problem: RH BOWEL ELIMINATION Goal: RH STG MANAGE BOWEL WITH ASSISTANCE Description: STG Manage Bowel with mod Assistance. Outcome: Progressing   Problem: RH BLADDER ELIMINATION Goal: RH STG MANAGE BLADDER WITH ASSISTANCE Description: STG Manage Bladder With Mod I Assistance Outcome: Progressing   Problem: RH SKIN INTEGRITY Goal: RH STG SKIN FREE OF INFECTION/BREAKDOWN Description: Assess skin q shift and as needed Outcome: Progressing Goal: RH STG MAINTAIN SKIN INTEGRITY WITH ASSISTANCE Description: STG Maintain Skin Integrity With MOD I Assistance. Outcome: Progressing   Problem: RH SAFETY Goal: RH STG ADHERE TO SAFETY PRECAUTIONS W/ASSISTANCE/DEVICE Description: STG Adhere to Safety Precautions With Mod I Assistance/Device. Outcome: Progressing Goal: RH STG DECREASED RISK OF FALL WITH ASSISTANCE Description: STG Decreased Risk of Fall With Mod I Assistance. Outcome: Progressing   Problem: RH PAIN MANAGEMENT Goal: RH STG PAIN MANAGED AT OR BELOW PT'S PAIN GOAL Description: <2 Outcome: Progressing   
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
## 2021-01-14 ENCOUNTER — Inpatient Hospital Stay (HOSPITAL_COMMUNITY): Payer: 59 | Admitting: Physical Therapy

## 2021-01-14 ENCOUNTER — Encounter (HOSPITAL_COMMUNITY): Payer: 59 | Admitting: Occupational Therapy

## 2021-01-14 ENCOUNTER — Inpatient Hospital Stay (HOSPITAL_COMMUNITY): Payer: 59

## 2021-01-14 NOTE — Progress Notes (Signed)
Occupational Therapy Session Note  Patient Details  Name: Denise [name] MRN: [id number] Date of Birth: [birth year]  Today's Date: 01/14/2021 OT Group Time: 1110-1155 OT Group Time Calculation (min): 45 min  Skilled Therapeutic Interventions/Progress Updates:    Pt engaged in therapeutic w/c level dance group focusing on patient choice, UE/LE strengthening, salience, activity tolerance, and social participation. Pt was guided through various dance-based exercises involving UEs/LEs and trunk. All music was selected by group members. Emphasis placed on activity tolerance and social participation. Pt participated well during group, interacted with others and remained engaged during exercise, assisting with song selection as well. Pt reported dance group was "the most fun" she's had in "over a month" since she's been at the hospital. Pt was returned to the room by OT.   Pt wearing c-collar throughout session     Therapy Documentation Precautions:  Precautions Precautions: Fall,Cervical Precaution Comments: C collar Required Braces or Orthoses: Cervical Brace,Splint/Cast Cervical Brace: Hard collar,At all times Splint/Cast: L wrist support Restrictions Weight Bearing Restrictions: Yes LUE Weight Bearing: Weight bearing as tolerated Vital Signs: Therapy Vitals Pulse Rate: 79 BP: 99/60 Pain: no s/s pain during session Pain Assessment Pain Scale: 0-10 Pain Score: 0-No pain ADL: ADL Eating: Set up Grooming: Setup Upper Body Bathing: Setup Where Assessed-Upper Body Bathing: Shower Lower Body Bathing: Supervision/safety Where Assessed-Lower Body Bathing: Shower Upper Body Dressing: Supervision/safety Where Assessed-Upper Body Dressing: Edge of bed Lower Body Dressing: Supervision/safety Where Assessed-Lower Body Dressing: Edge of bed Toileting: Supervision/safety Where Assessed-Toileting: Teacher, adult education: Close supervision Statistician Method: Advertising copywriter: Close supervision Film/video editor Method: Designer, industrial/product: Transfer tub bench,Grab bars      Therapy/Group: Group Therapy  [employer] 01/14/2021, 12:31 PM
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
## 2021-01-14 NOTE — Progress Notes (Signed)
Physical Therapy Session Note  Patient Details  Name: Denise [name] MRN: [id number] Date of Birth: 1996-06-03  Today's Date: 01/14/2021 PT Individual Time: 1000-1100 PT Individual Time Calculation (min): 60 min   Short Term Goals: Week 2:  PT Short Term Goal 1 (Week 2): STGs=LTGs  Skilled Therapeutic Interventions/Progress Updates:   Pt received supine in bed and agreeable to PT. Supine>sit transfer without assist or cues. Pt able to don Bil shoes with increased time and supervision assist.   Gait training through hall 4x 56f to and from room and gym as well as 2021fthrough hall of rehab unit. Supervision assist overall from PT with noted foot drag on the RLE intermittently, but no LOB noted. Pt reports mild discomfort in the R heel causing her to walk with forefoot contact.   PT instructed pt in sustained attention task of Wii sports Bowling x 10 frames, tennis x 5 games and baseball x 4 innings. Sit<>stand throughout use of wii from arm chair with supervision assist and Supervision assist from PT to balance, occasional lateral LOB R and L, but able to correct without additional assist from PT.   Nustep reciprocal cardio vascular endurance training, level 6., 5 min +5 min. Cues from PT to keep SPM >55. Pt rates Borg RPE 15/20 following first bout and 16/20 following second.   Pt returned to room and requesting to change prior to dance group. Pt able to locate all clothes and don with distant supervision assist from PT, one near LOB, but pt utilized wall for support while continuing to don pants. Patient returned to room and performed stand pivot to EOB with supervision assist. Pt left sitting up in bed, with call bell in reach and all needs met.            Therapy Documentation Precautions:  Precautions Precautions: Fall,Cervical Precaution Comments: C collar Required Braces or Orthoses: Cervical Brace,Splint/Cast Cervical Brace: Hard collar,At all times Splint/Cast: L wrist  support Restrictions Weight Bearing Restrictions: Yes LUE Weight Bearing: Weight bearing as tolerated    Vital Signs: Therapy Vitals Temp: 98.2 F (36.8 C) Temp Source: Oral Pulse Rate: 75 Resp: 18 BP: 101/61 Patient Position (if appropriate): Lying Oxygen Therapy SpO2: 100 % O2 Device: Room Air Pain: Denies at rest    Therapy/Group: Individual Therapy  AuLorie Phenix/22/2022, 2:39 PM

## 2021-01-14 NOTE — Plan of Care (Signed)
  Problem: Consults Goal: RH BRAIN INJURY PATIENT EDUCATION Description: Description: See Patient Education module for eduction specifics Outcome: Progressing   Problem: RH BOWEL ELIMINATION Goal: RH STG MANAGE BOWEL WITH ASSISTANCE Description: STG Manage Bowel with mod Assistance. Outcome: Progressing   Problem: RH BLADDER ELIMINATION Goal: RH STG MANAGE BLADDER WITH ASSISTANCE Description: STG Manage Bladder With Mod I Assistance Outcome: Progressing   Problem: RH SKIN INTEGRITY Goal: RH STG SKIN FREE OF INFECTION/BREAKDOWN Description: Assess skin q shift and as needed Outcome: Progressing Goal: RH STG MAINTAIN SKIN INTEGRITY WITH ASSISTANCE Description: STG Maintain Skin Integrity With MOD I Assistance. Outcome: Progressing   Problem: RH SAFETY Goal: RH STG ADHERE TO SAFETY PRECAUTIONS W/ASSISTANCE/DEVICE Description: STG Adhere to Safety Precautions With Mod I Assistance/Device. Outcome: Progressing Goal: RH STG DECREASED RISK OF FALL WITH ASSISTANCE Description: STG Decreased Risk of Fall With Mod I Assistance. Outcome: Progressing   Problem: RH PAIN MANAGEMENT Goal: RH STG PAIN MANAGED AT OR BELOW PT'S PAIN GOAL Description: <2 Outcome: Progressing   

## 2021-01-15 ENCOUNTER — Inpatient Hospital Stay (HOSPITAL_COMMUNITY): Payer: 59 | Admitting: Speech Pathology

## 2021-01-15 ENCOUNTER — Inpatient Hospital Stay (HOSPITAL_COMMUNITY): Payer: 59

## 2021-01-15 ENCOUNTER — Inpatient Hospital Stay (HOSPITAL_COMMUNITY): Payer: 59 | Admitting: Occupational Therapy
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
## 2021-01-15 NOTE — Plan of Care (Signed)
  Problem: Consults Goal: RH BRAIN INJURY PATIENT EDUCATION Description: Description: See Patient Education module for eduction specifics Outcome: Progressing   Problem: RH BOWEL ELIMINATION Goal: RH STG MANAGE BOWEL WITH ASSISTANCE Description: STG Manage Bowel with mod Assistance. Outcome: Progressing   Problem: RH BLADDER ELIMINATION Goal: RH STG MANAGE BLADDER WITH ASSISTANCE Description: STG Manage Bladder With Mod I Assistance Outcome: Progressing   Problem: RH SKIN INTEGRITY Goal: RH STG SKIN FREE OF INFECTION/BREAKDOWN Description: Assess skin q shift and as needed Outcome: Progressing Goal: RH STG MAINTAIN SKIN INTEGRITY WITH ASSISTANCE Description: STG Maintain Skin Integrity With MOD I Assistance. Outcome: Progressing   Problem: RH SAFETY Goal: RH STG ADHERE TO SAFETY PRECAUTIONS W/ASSISTANCE/DEVICE Description: STG Adhere to Safety Precautions With Mod I Assistance/Device. Outcome: Progressing Goal: RH STG DECREASED RISK OF FALL WITH ASSISTANCE Description: STG Decreased Risk of Fall With Mod I Assistance. Outcome: Progressing   Problem: RH PAIN MANAGEMENT Goal: RH STG PAIN MANAGED AT OR BELOW PT'S PAIN GOAL Description: <2 Outcome: Progressing   
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
## 2021-01-15 NOTE — Progress Notes (Signed)
Physical Therapy Session Note  Patient Details  Name: Denise [name] MRN: [id number] Date of Birth: 29-Jan-1996  Today's Date: 01/15/2021 PT Individual Time: 1115-1200 PT Individual Time Calculation (min): 45 min   Short Term Goals: Week 2:  PT Short Term Goal 1 (Week 2): STGs=LTGs  Skilled Therapeutic Interventions/Progress Updates:     Patient in bed coloring upon PT arrival. Patient alert and agreeable to PT session. Patient reported 5/10 throbbing pain at her laceration on her head during session, RN made aware. PT provided repositioning, rest breaks, and distraction as pain interventions throughout session.   Therapeutic Activity: Bed Mobility: Patient performed supine to/from sit indipendently.  Transfers: Patient performed sit to/from stand independently throughout session. Performed ambulatory toilet transfer during session with supervision for balance during gait and independent with toilet transfer, peri-care, and lower body clothing management.  Five times Sit to Stand Test (FTSS) Method: Use a straight back chair with a solid seat that is 16-18" high. Ask participant to sit on the chair with arms folded across their chest.   Instructions: "Stand up and sit down as quickly as possible 5 times, keeping your arms folded across your chest."   Measurement: Stop timing when the participant stands the 5th time.  TIME: __13.4____ (in seconds)  Times > 13.6 seconds is associated with increased disability and morbidity (Guralnik, 2000) Times > 15 seconds is predictive of recurrent falls in healthy individuals aged 71 and older (Buatois, et al., 2008) Normal performance values in community dwelling individuals aged 29 and older (Bohannon, 2006): o 60-69 years: 11.4 seconds o 70-79 years: 12.6 seconds o 80-89 years: 14.8 seconds  MCID: ? 2.3 seconds for Vestibular Disorders (Meretta, 2006)  Gait Training:  Patient ambulated >100 feet x3 without an AD with close supervision for  safety and intermittent CGA x2 for minor LOB. Ambulated with narrow BOS, decreased arm swing, intermittent toe catching on R. Provided verbal cues for reduced gait speed for attention to R foot, increased BOS, and attention to R visual field and motor control. 6 Min Walk Test:  Instructed patient to ambulate as quickling and as safely as possible for 6 minutes using LRAD. Patient was allowed to take standing rest breaks without stopping the test, but if he required a sitting rest break the clock would be stopped and the test would be over.  Results: 1296 feet with close supervision for safety without an AD, RPE 5/10 after with reports of mild light headedness, resolved in sitting without further complaint during session. Results indicate that patient has decreased activity tolerance/endurance compared to age-matched norms.  Neuromuscular Re-ed: Patient performed the following activities: Patient demonstrates increased fall risk as noted by score of 52/56 on Berg Balance Scale.  (<36= high risk for falls, close to 100%; 37-45 significant >80%; 46-51 moderate >50%; 52-55 lower >25%) Educated patient on results and interpretation and improvement from last assessment, 48/56 on 1/18)  Educated patient on fall risk/prevention, home modifications to prevent falls, and activation of emergency services in the event of a fall during session. Discussed d/c planning, patient reports she spoke with her Aunt today. Her Aunt has cancelled a trip and if offering to allow the patient to stay with her at d/c, CSW and rehab team made aware.   Patient in bed at end of session with breaks locked, bed alarm set, and all needs within reach.    Therapy Documentation Precautions:  Precautions Precautions: Fall,Cervical Precaution Comments: C collar Required Braces or Orthoses: Cervical Brace,Splint/Cast Cervical Brace:  Hard collar,At all times Splint/Cast: L wrist support Restrictions Weight Bearing Restrictions:  Yes LUE Weight Bearing: Weight bearing as tolerated Balance: Balance Balance Assessed: Yes Standardized Balance Assessment Standardized Balance Assessment: Berg Balance Test Berg Balance Test Sit to Stand: Able to stand without using hands and stabilize independently Standing Unsupported: Able to stand safely 2 minutes Sitting with Back Unsupported but Feet Supported on Floor or Stool: Able to sit safely and securely 2 minutes Stand to Sit: Sits safely with minimal use of hands Transfers: Able to transfer safely, minor use of hands Standing Unsupported with Eyes Closed: Able to stand 10 seconds safely Standing Ubsupported with Feet Together: Able to place feet together independently and stand 1 minute safely From Standing, Reach Forward with Outstretched Arm: Can reach forward >12 cm safely (5") From Standing Position, Pick up Object from Floor: Able to pick up shoe, needs supervision From Standing Position, Turn to Look Behind Over each Shoulder: Looks behind from both sides and weight shifts well Turn 360 Degrees: Able to turn 360 degrees safely in 4 seconds or less Standing Unsupported, Alternately Place Feet on Step/Stool: Able to stand independently and safely and complete 8 steps in 20 seconds Standing Unsupported, One Foot in Front: Able to plae foot ahead of the other independently and hold 30 seconds Standing on One Leg: Able to lift leg independently and hold 5-10 seconds Total Score: 52/56    Therapy/Group: Individual Therapy  Odarius Dines L Yordin Rhoda PT, DPT  01/15/2021, 12:12 PM

## 2021-01-15 NOTE — Progress Notes (Signed)
Occupational Therapy Session Note  Patient Details  Name: Denise [name] MRN: [id number] Date of Birth: 1996/07/23  Today's Date: 01/15/2021 OT Individual Time: 1345-1430 OT Individual Time Calculation (min): 45 min     Skilled Therapeutic Interventions/Progress Updates:    Pt greeted in bed, teary, very sad that her doctor "cancelled" her "apartment." Pt hopeful to d/c in 2 days, feeling discouraged and stating she's afraid staff may change d/c date for some reason. She requested to listen to some music during session to cheer her up. Pt ambulated to the dayroom with CGA-close supervision without AD and then sat at a table. She colored while listening to music, conversing with therapist which appeared to brighten affect. Discussed emotional coping strategies to use while in the room at CIR. Pt returned to the room and completed toileting at ambulatory level with supervision (+bladder void), washed hands, and then returned to EOB. OT provided pt with an adaptive way to control music listening on the computer in her room. Pt appreciative as she does not have her cell phone here. Left pt with all needs within reach and bed alarm set. Tx focus placed on dynamic standing balance, functional transfers, and psychosocial health.   Therapy Documentation Precautions:  Precautions Precautions: Fall,Cervical Precaution Comments: C collar Required Braces or Orthoses: Cervical Brace,Splint/Cast Cervical Brace: Hard collar,At all times Splint/Cast: L wrist support Restrictions Weight Bearing Restrictions: Yes LUE Weight Bearing: Weight bearing as tolerated Vital Signs: Therapy Vitals Temp: 98 F (36.7 C) Pulse Rate: 86 Resp: 18 BP: (!) 97/55 Patient Position (if appropriate): Sitting Oxygen Therapy SpO2: 100 % O2 Device: Room Air Pain: no s/s pain during tx Pain Assessment Pain Scale: 0-10 Pain Score: 0-No pain ADL: ADL Eating: Set up Grooming: Setup Upper Body Bathing: Setup Where  Assessed-Upper Body Bathing: Shower Lower Body Bathing: Supervision/safety Where Assessed-Lower Body Bathing: Shower Upper Body Dressing: Supervision/safety Where Assessed-Upper Body Dressing: Edge of bed Lower Body Dressing: Supervision/safety Where Assessed-Lower Body Dressing: Edge of bed Toileting: Supervision/safety Where Assessed-Toileting: Teacher, adult education: Close supervision Statistician Method: Event organiser: Close supervision Film/video editor Method: Designer, industrial/product: Transfer tub bench,Grab bars     Therapy/Group: Individual Therapy  Denise [name] 01/15/2021, 3:50 PM

## 2021-01-16 ENCOUNTER — Inpatient Hospital Stay (HOSPITAL_COMMUNITY): Payer: 59

## 2021-01-16 ENCOUNTER — Inpatient Hospital Stay (HOSPITAL_COMMUNITY): Payer: 59 | Admitting: Occupational Therapy

## 2021-01-16 DIAGNOSIS — K5901 Slow transit constipation: Secondary | ICD-10-CM

## 2021-01-16 DIAGNOSIS — R454 Irritability and anger: Secondary | ICD-10-CM

## 2021-01-16 MED ORDER — LIDOCAINE 5 % EX PTCH: 2.0000 | MEDICATED_PATCH | CUTANEOUS | 0 refills | Status: DC

## 2021-01-16 MED ORDER — LORAZEPAM 1 MG PO TABS: 1.0000 mg | ORAL_TABLET | Freq: Three times a day (TID) | ORAL | 0 refills | Status: DC

## 2021-01-16 MED ORDER — METHOCARBAMOL 500 MG PO TABS: 1000.0000 mg | ORAL_TABLET | Freq: Four times a day (QID) | ORAL | 0 refills | Status: DC

## 2021-01-16 MED ORDER — DOCUSATE SODIUM 100 MG PO CAPS: 100.0000 mg | ORAL_CAPSULE | Freq: Three times a day (TID) | ORAL | 0 refills | Status: DC

## 2021-01-16 MED ORDER — DESMOPRESSIN ACETATE 0.1 MG PO TABS: 0.1000 mg | ORAL_TABLET | Freq: Two times a day (BID) | ORAL | 0 refills | Status: DC

## 2021-01-16 MED ORDER — OXYCODONE HCL 5 MG PO TABS: 5.0000 mg | ORAL_TABLET | ORAL | 0 refills | Status: DC | PRN

## 2021-01-16 MED ORDER — TRAZODONE HCL 100 MG PO TABS: 100.0000 mg | ORAL_TABLET | Freq: Every day | ORAL | 0 refills | Status: DC

## 2021-01-16 MED ORDER — MELATONIN 10 MG PO TABS: 10.0000 mg | ORAL_TABLET | Freq: Every day | ORAL | 0 refills | Status: AC

## 2021-01-16 MED ORDER — TOPIRAMATE 25 MG PO TABS: 25.0000 mg | ORAL_TABLET | Freq: Two times a day (BID) | ORAL | 0 refills | Status: DC

## 2021-01-16 MED ORDER — PROPRANOLOL HCL 10 MG PO TABS: 30.0000 mg | ORAL_TABLET | Freq: Three times a day (TID) | ORAL | 0 refills | Status: DC

## 2021-01-16 NOTE — Progress Notes (Signed)
Removed Fentanyl patch from pt and discarded. Witnessed by C. Logan Bores, RN

## 2021-01-16 NOTE — Progress Notes (Signed)
Physical Therapy Session Note  Patient Details  Name: Denise [name] MRN: [id number] Date of Birth: 09-29-1996  Today's Date: 01/16/2021 PT Individual Time: 8101-7510 PT Individual Time Calculation (min): 30 min   Short Term Goals: Week 2:  PT Short Term Goal 1 (Week 2): STGs=LTGs  Skilled Therapeutic Interventions/Progress Updates:     Pt received supine in bed and agrees to therapy. No complaint of pain. Pt performs bed mobility independently. Pt dons shoes and socks at EOB with setup assistance. Sit to stand independent. Pt ambulates x100' to dayroom with supervision and cues to decrease width of stride for improved efficiency. Pt performs dynamic gait challenge, ambulating and performing toe taps on cones. Pt has some difficulty with accuracy and precision of toe taps but no LOBs during activity. Pt then performs quadruped exercise for core strengthening and improved balance. Pt alternates lifting each arm x10 reps, alternates lifting legs x10 reps, then lifts arm and contralateral leg with minA form PT x10. Pt ambulates 100' back to room. Left supine in bed with alarm intact and all needs within reach.  Therapy Documentation Precautions:  Precautions Precautions: Fall,Cervical Precaution Comments: C collar Required Braces or Orthoses: Cervical Brace,Splint/Cast Cervical Brace: Hard collar,At all times Splint/Cast: L wrist support Restrictions Weight Bearing Restrictions: Yes LUE Weight Bearing: Weight bearing as tolerated   Therapy/Group: Individual Therapy  Beau Fanny, PT, DPT 01/16/2021, 4:05 PM

## 2021-01-16 NOTE — Progress Notes (Signed)
Occupational Therapy Session Note  Patient Details  Name: Denise [name] MRN: [id number] Date of Birth: 06-Mar-1996  Today's Date: 01/16/2021 OT Individual Time: 0930-1050 OT Individual Time Calculation (min): 80 min    Short Term Goals: Week 2:  OT Short Term Goal 1 (Week 2): STG= LTG d/t ELOS  Skilled Therapeutic Interventions/Progress Updates:    Pt seen this session to focus on balance with ADLS.  Pt did extremely well today completing gathering clothing, showering, dressing all with Supervision.  Considerable time spent in shower with A from therapist to wash blood out of hair and use conditioner to detangle hair.   After shower, pt sat at sink to brush teeth and then RN and OT continued to work on detangling her hair.  Pt in room with RN at end of session. Telesitter on.   Therapy Documentation Precautions:  Precautions Precautions: Fall,Cervical Precaution Comments: C collar Required Braces or Orthoses: Cervical Brace,Splint/Cast Cervical Brace: Hard collar,At all times Splint/Cast: L wrist support Restrictions Weight Bearing Restrictions: Yes LUE Weight Bearing: Weight bearing as tolerated    Pain: Pain Assessment Pain Score: 0-No pain ADL: ADL Eating: Independent Grooming: Independent Upper Body Bathing: Setup Where Assessed-Upper Body Bathing: Shower Lower Body Bathing: Supervision/safety Where Assessed-Lower Body Bathing: Shower Upper Body Dressing: Supervision/safety Where Assessed-Upper Body Dressing: Edge of bed Lower Body Dressing: Supervision/safety Where Assessed-Lower Body Dressing: Edge of bed Toileting: Supervision/safety Where Assessed-Toileting: Teacher, adult education: Close supervision Statistician Method: Event organiser: Close supervision Film/video editor Method: Designer, industrial/product: Transfer tub bench,Grab bars   Therapy/Group: Individual Therapy  SAGUIER,JULIA 01/16/2021, 12:17 PM

## 2021-01-16 NOTE — Progress Notes (Signed)
Inpatient Rehabilitation Care Coordinator Discharge Note  The overall goal for the admission was met for:   Discharge location: Yes. D/c to her mother's home with 24/7 care.  Length of Stay: Yes. 16 days.   Discharge activity level: Yes. Supervision.  Home/community participation: Yes. Limited.   Services provided included: MD, RD, PT, OT, SLP, RN, CM, TR, Pharmacy, Neuropsych and SW  Financial Services: Private Insurance: [place] offered to/list presented to: Yes  Follow-up services arranged: Outpatient: [facility] for outpatient PT/OT/SLP and DME: none needed  Comments (or additional information): contact pt mother Benjamine Mola [phone]  Patient/Family verbalized understanding of follow-up arrangements: Yes  Individual responsible for coordination of the follow-up plan: Pt to have assistance with coordinating care needs.   Confirmed correct DME delivered: Rana Snare 01/16/2021    Rana Snare

## 2021-01-16 NOTE — Progress Notes (Signed)
Patient ID: Denise [name], female   DOB: April 09, 1996, 25 y.o.   MRN: [id number]  SW received phone call from pt mother Lanora Manis who inquired about concerns related to patient d/c tomorrow, and various questions related to how to care for pt. SW informed will speak with medical team about pt discharging tomorrow as no updates. Also inquired if she would be more comfortable with coming in for therapy to see pt progress and to get questions answered. Mother amenable. SW to follow-up. *Medical team in agreement with pt d/c tomorrow after therapies. SW called pt mother Lanora Manis to inform on therapies beginning at 7:30am. She intends to be present at this time.   Cecile Sheerer, MSW, LCSWA Office: [phone] Cell: [phone] Fax: [phone]
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
## 2021-01-16 NOTE — Progress Notes (Signed)
Speech Language Pathology Daily Session Note  Patient Details  Name: Denise [name] MRN: [id number] Date of Birth: 05-24-1996  Today's Date: 01/16/2021 SLP Individual Time: 1155-2080 SLP Individual Time Calculation (min): 30 min  Short Term Goals: Week 2: SLP Short Term Goal 1 (Week 2): STGs=LTGs due to ELOS  Skilled Therapeutic Interventions:Skilled ST services focused on cognitive skills. SLP administered subsections of CLQT to assess cognitive linguistic skills. Pt scored WFL on story retelling, design memory and confrontational naming, with below normal limits in generative naming, suggest due to attention deficits versus language. SLP provided education and will complete remainder portions of assessment in the next session if possible. Pt was left in room with call bell within reach and bed alarm set. SLP recommends to continue skilled services.     Pain Pain Assessment Pain Score: 0-No pain  Therapy/Group: Individual Therapy  Scott Fix  [facility] 01/16/2021, 1:31 PM

## 2021-01-17 ENCOUNTER — Inpatient Hospital Stay (HOSPITAL_COMMUNITY): Payer: 59

## 2021-01-17 ENCOUNTER — Inpatient Hospital Stay (HOSPITAL_COMMUNITY): Payer: 59 | Admitting: Occupational Therapy

## 2021-01-17 ENCOUNTER — Inpatient Hospital Stay (HOSPITAL_COMMUNITY): Payer: 59 | Admitting: Speech Pathology

## 2021-01-17 NOTE — Progress Notes (Signed)
Continue to assess and monitor patient throughout shift, sleep chart continue, Appears asleep the majority of shift,respiration even and unlabored coloration appears adequate

## 2021-01-17 NOTE — Progress Notes (Signed)
Patient ID: Denise [name], female   DOB: 11/06/1996, 25 y.o.   MRN: [id number]  SW met with pt and pt dtr in room to review discharge. SW to provide Medicaid and SSA kit.  *SW provided resources.   Loralee Pacas, MSW, [place] Office: [phone] Cell: [phone] Fax: [phone]

## 2021-01-17 NOTE — Progress Notes (Signed)
Pt discharged home with family. Discharge instructions given by [name], PA. Pt denies any pain or discomfort. Meds given, dressing change education done with pt's mother. No further questions from pt or family.   Marylu Lund, RN

## 2021-01-17 NOTE — Progress Notes (Signed)
Patient anticipates discharge home today, uneventful and resting the majority of the shift, Medicated x1 for headache, pain score, Dressing per orders  provided

## 2021-01-17 NOTE — Progress Notes (Signed)
Occupational Therapy Discharge Summary  Patient Details  Name: Denise [name] MRN: [id number] Date of Birth: Mar 13, 1996  Today's Date: 01/17/2021 OT Individual Time: 6440-3474 OT Individual Time Calculation (min): 58 min   Pt greeted seated EOB brushing hair with mother present. Session focused on family education regarding BADL tasks. OT educated pt's mother on donning/doffing c-collar. Had mother take pictures of correct placement of c-collar on pt and placement of ads. OT demonstrated c-collar removal with pt in supine then mother demonstrated understanding. Pt's mother then demonstrated ability to change out pads and reviewed BADLs sequencing and cervical precautions. Pt then ambulated to therapy apartment and practiced stepping over shower ledge in simulated home environment. Pt returned to room and OT provided pt with different bruiush to help get out tangles. Pt left seated EOB at end of session with mother present and needs met.   Patient has met 14 of 14 long term goals due to improved activity tolerance, improved balance, postural control, improved attention, improved awareness and improved coordination.  Patient to discharge at overall Supervision level.  Patient's care partner is independent to provide the necessary physical and cognitive assistance at discharge.    Reasons goals not met: n/a  Recommendation:  Patient will benefit from ongoing skilled OT services in outpatient setting to continue to advance functional skills in the area of BADL and funtional use of L UE.  Equipment: No equipment provided  Reasons for discharge: treatment goals met and discharge from hospital  Patient/family agrees with progress made and goals achieved: Yes  OT Discharge Precautions/Restrictions  Precautions Precautions: Fall;Cervical Precaution Comments: C collar Required Braces or Orthoses: Cervical Brace Cervical Brace: Hard collar;At all times Splint/Cast: L wrist  support Restrictions Weight Bearing Restrictions: Yes LUE Weight Bearing: Weight bearing as tolerated Pain Pain Assessment Pain Scale: 0-10 Pain Score: 0-No pain ADL ADL Eating: Independent Grooming: Independent Upper Body Bathing: Setup Where Assessed-Upper Body Bathing: Shower Lower Body Bathing: Supervision/safety Where Assessed-Lower Body Bathing: Shower Upper Body Dressing: Supervision/safety Where Assessed-Upper Body Dressing: Edge of bed Lower Body Dressing: Supervision/safety Where Assessed-Lower Body Dressing: Edge of bed Toileting: Independent Where Assessed-Toileting: Glass blower/designer: Close supervision Toilet Transfer Method: Magazine features editor: Close supervision Social research officer, government Method: Heritage manager: Network engineer bench,Grab bars Vision Baseline Vision/History: No visual deficits Perception  Perception: Within Financial controller Praxis: Intact Cognition Overall Cognitive Status: Impaired/Different from baseline Arousal/Alertness: Awake/alert Orientation Level: Oriented X4 Attention: Sustained;Selective Focused Attention: Appears intact Focused Attention Impairment: Functional basic;Verbal basic Sustained Attention: Appears intact Selective Attention: Impaired Selective Attention Impairment: Verbal complex;Functional complex Memory: Impaired Memory Impairment: Decreased recall of new information Decreased Short Term Memory: Functional complex;Verbal complex Awareness Impairment: Anticipatory impairment Problem Solving: Impaired Problem Solving Impairment: Functional complex;Verbal complex Behaviors: Poor frustration tolerance Safety/Judgment: Impaired [employer] Scales of Cognitive Functioning: Purposeful/appropriate Sensation Sensation Light Touch: Appears Intact Hot/Cold: Appears Intact Proprioception: Appears Intact Stereognosis: Appears Intact Coordination Gross Motor Movements  are Fluid and Coordinated: Yes Fine Motor Movements are Fluid and Coordinated: Yes Motor  Motor Motor: Within Functional Limits Mobility  Bed Mobility Bed Mobility: Supine to Sit;Sit to Supine Rolling Right: Independent Rolling Left: Independent Supine to Sit: Independent Sit to Supine: Independent Transfers Sit to Stand: Independent Stand to Sit: Independent  Trunk/Postural Assessment  Cervical Assessment Cervical Assessment: Exceptions to [facility] (in cervical brace) Thoracic Assessment Thoracic Assessment: Within Functional Limits Lumbar Assessment Lumbar Assessment: Within Functional Limits Postural Control Postural Control: Within Functional Limits  Balance Balance Balance  Assessed: Yes Standardized Balance Assessment Standardized Balance Assessment: Berg Balance Test Berg Balance Test Sit to Stand: Able to stand without using hands and stabilize independently Standing Unsupported: Able to stand safely 2 minutes Sitting with Back Unsupported but Feet Supported on Floor or Stool: Able to sit safely and securely 2 minutes Stand to Sit: Sits safely with minimal use of hands Transfers: Able to transfer safely, minor use of hands Standing Unsupported with Eyes Closed: Able to stand 10 seconds safely Standing Ubsupported with Feet Together: Able to place feet together independently and stand 1 minute safely From Standing, Reach Forward with Outstretched Arm: Can reach forward >12 cm safely (5") From Standing Position, Pick up Object from Floor: Able to pick up shoe safely and easily From Standing Position, Turn to Look Behind Over each Shoulder: Looks behind from both sides and weight shifts well Turn 360 Degrees: Able to turn 360 degrees safely in 4 seconds or less Standing Unsupported, Alternately Place Feet on Step/Stool: Able to stand independently and safely and complete 8 steps in 20 seconds Standing Unsupported, One Foot in Front: Able to plae foot ahead of the other  independently and hold 30 seconds Standing on One Leg: Able to lift leg independently and hold 5-10 seconds Total Score: 53 Static Sitting Balance Static Sitting - Balance Support: Feet supported Static Sitting - Level of Assistance: 7: Independent Dynamic Sitting Balance Dynamic Sitting - Balance Support: Feet supported Dynamic Sitting - Level of Assistance: 7: Independent Static Standing Balance Static Standing - Balance Support: No upper extremity supported Static Standing - Level of Assistance: 7: Independent Dynamic Standing Balance Dynamic Standing - Balance Support: No upper extremity supported Dynamic Standing - Level of Assistance: 5: Stand by assistance Extremity/Trunk Assessment RUE Assessment RUE Assessment: Within Functional Limits Active Range of Motion (AROM) Comments: pain w/overhead reach/rib fractures LUE Assessment LUE Assessment: Exceptions to [facility] Active Range of Motion (AROM) Comments: L wrist in splint due to fractures   Denise [name] 01/17/2021, 12:49 PM

## 2021-01-17 NOTE — Plan of Care (Signed)
  Problem: RH Balance Goal: LTG: Patient will maintain dynamic sitting balance (OT) Description: LTG:  Patient will maintain dynamic sitting balance with assistance during activities of daily living (OT) Outcome: Completed/Met Goal: LTG Patient will maintain dynamic standing with ADLs (OT) Description: LTG:  Patient will maintain dynamic standing balance with assist during activities of daily living (OT)  Outcome: Completed/Met   Problem: Sit to Stand Goal: LTG:  Patient will perform sit to stand in prep for activites of daily living with assistance level (OT) Description: LTG:  Patient will perform sit to stand in prep for activites of daily living with assistance level (OT) Outcome: Completed/Met   Problem: RH Eating Goal: LTG Patient will perform eating w/assist, cues/equip (OT) Description: LTG: Patient will perform eating with assist, with/without cues using equipment (OT) Outcome: Completed/Met   Problem: RH Grooming Goal: LTG Patient will perform grooming w/assist,cues/equip (OT) Description: LTG: Patient will perform grooming with assist, with/without cues using equipment (OT) Outcome: Completed/Met   Problem: RH Bathing Goal: LTG Patient will bathe all body parts with assist levels (OT) Description: LTG: Patient will bathe all body parts with assist levels (OT) Outcome: Completed/Met   Problem: RH Dressing Goal: LTG Patient will perform upper body dressing (OT) Description: LTG Patient will perform upper body dressing with assist, with/without cues (OT). Outcome: Completed/Met Goal: LTG Patient will perform lower body dressing w/assist (OT) Description: LTG: Patient will perform lower body dressing with assist, with/without cues in positioning using equipment (OT) Outcome: Completed/Met   Problem: RH Toileting Goal: LTG Patient will perform toileting task (3/3 steps) with assistance level (OT) Description: LTG: Patient will perform toileting task (3/3 steps) with  assistance level (OT)  Outcome: Completed/Met   Problem: RH Toilet Transfers Goal: LTG Patient will perform toilet transfers w/assist (OT) Description: LTG: Patient will perform toilet transfers with assist, with/without cues using equipment (OT) Outcome: Completed/Met   Problem: RH Tub/Shower Transfers Goal: LTG Patient will perform tub/shower transfers w/assist (OT) Description: LTG: Patient will perform tub/shower transfers with assist, with/without cues using equipment (OT) Outcome: Completed/Met   Problem: RH Memory Goal: LTG Patient will demonstrate ability for day to day recall/carry over during activities of daily living with assistance level (OT) Description: LTG:  Patient will demonstrate ability for day to day recall/carry over during activities of daily living with assistance level (OT). Outcome: Completed/Met   Problem: RH Attention Goal: LTG Patient will demonstrate this level of attention during functional activites (OT) Description: LTG:  Patient will demonstrate this level of attention during functional activites  (OT) Outcome: Completed/Met   Problem: RH Awareness Goal: LTG: Patient will demonstrate awareness during functional activites type of (OT) Description: LTG: Patient will demonstrate awareness during functional activites type of (OT) Outcome: Completed/Met   
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
## 2021-01-17 NOTE — Progress Notes (Signed)
Physical Therapy Discharge Summary  Patient Details  Name: Denise [name] MRN: [id number] Date of Birth: Jan 14, 1996  Today's Date: 01/17/2021 PT Individual Time: 1103-1200 PT Individual Time Calculation (min): 57 min    Patient has met 10 of 10 long term goals due to improved activity tolerance, improved balance, improved postural control, increased strength and improved awareness.  Patient to discharge at an ambulatory level Supervision.   Patient's care partner is independent to provide the necessary physical assistance at discharge.  Reasons goals not met: NA  Recommendation:  Patient will benefit from ongoing skilled PT services in outpatient setting to continue to advance safe functional mobility, address ongoing impairments in balance, high level ambulation,, and minimize fall risk.  Equipment: none  Reasons for discharge: treatment goals met and discharge from hospital  Patient/family agrees with progress made and goals achieved: Yes   Skilled Therapeutic Interventions: Pt received supine in bed and agrees to therapy. Bed mobility independent. PT answers pt's mothers questions regarding discharge and safe mobility. Pt dons shoes and socks with setup assistance. Sit to stand multiple times during session, independently. Pt ambulates 300' with cues for increased stride width for safety, as well as cues to redirect pt's attention to task to improve safety. Pt performs car transfer and ramp navigation with cues for sequencing. Pt ambulates x150' to therapy stairs. Pt completes x12 6" steps with R hand rail. Pt then performs BERG balance assessment, as detailed below. Pt ambulates back to room. Left in bed with all needs within reach.  PT Discharge Precautions/Restrictions Precautions Precautions: Fall;Cervical Required Braces or Orthoses: Cervical Brace Restrictions Weight Bearing Restrictions: Yes LUE Weight Bearing: Weight bearing as tolerated Pain Pain Assessment Pain Scale:  0-10 Pain Score: 0-No pain Vision/Perception  Perception Perception: Within Functional Limits Praxis Praxis: Intact  Cognition Overall Cognitive Status: Impaired/Different from baseline Arousal/Alertness: Awake/alert Orientation Level: Oriented X4 Safety/Judgment: Impaired Sensation Sensation Light Touch: Appears Intact Coordination Gross Motor Movements are Fluid and Coordinated: Yes Fine Motor Movements are Fluid and Coordinated: Yes Motor  Motor Motor: Within Functional Limits  Mobility Bed Mobility Bed Mobility: Supine to Sit;Sit to Supine Supine to Sit: Independent Sit to Supine: Independent Transfers Transfers: Stand to Sit;Sit to Stand;Stand Pivot Transfers Sit to Stand: Independent Stand to Sit: Independent Stand Pivot Transfers: Independent Transfer (Assistive device): None Locomotion  Gait Ambulation: Yes Gait Assistance: Supervision/Verbal cueing Gait Distance (Feet): 300 Feet Assistive device: None Gait Assistance Details: Verbal cues for gait pattern Gait Gait: Yes Gait Pattern: Impaired Gait Pattern: Narrow base of support Stairs / Additional Locomotion Stairs: Yes Stairs Assistance: Supervision/Verbal cueing Stair Management Technique: One rail Right Number of Stairs: 12 Height of Stairs: 6 Ramp: Supervision/Verbal cueing Curb: Supervision/Verbal cueing Wheelchair Mobility Wheelchair Mobility: No  Trunk/Postural Assessment  Cervical Assessment Cervical Assessment: Exceptions to [facility] (in cervical brace) Thoracic Assessment Thoracic Assessment: Within Functional Limits Lumbar Assessment Lumbar Assessment: Within Functional Limits Postural Control Postural Control: Within Functional Limits  Balance Balance Balance Assessed: Yes Standardized Balance Assessment Standardized Balance Assessment: Berg Balance Test Berg Balance Test Sit to Stand: Able to stand without using hands and stabilize independently Standing Unsupported: Able to stand  safely 2 minutes Sitting with Back Unsupported but Feet Supported on Floor or Stool: Able to sit safely and securely 2 minutes Stand to Sit: Sits safely with minimal use of hands Transfers: Able to transfer safely, minor use of hands Standing Unsupported with Eyes Closed: Able to stand 10 seconds safely Standing Ubsupported with Feet Together: Able to  place feet together independently and stand 1 minute safely From Standing, Reach Forward with Outstretched Arm: Can reach forward >12 cm safely (5") From Standing Position, Pick up Object from Floor: Able to pick up shoe safely and easily From Standing Position, Turn to Look Behind Over each Shoulder: Looks behind from both sides and weight shifts well Turn 360 Degrees: Able to turn 360 degrees safely in 4 seconds or less Standing Unsupported, Alternately Place Feet on Step/Stool: Able to stand independently and safely and complete 8 steps in 20 seconds Standing Unsupported, One Foot in Front: Able to plae foot ahead of the other independently and hold 30 seconds Standing on One Leg: Able to lift leg independently and hold 5-10 seconds Total Score: 53 Static Sitting Balance Static Sitting - Level of Assistance: 7: Independent Dynamic Sitting Balance Dynamic Sitting - Level of Assistance: 7: Independent Static Standing Balance Static Standing - Level of Assistance: 7: Independent Dynamic Standing Balance Dynamic Standing - Level of Assistance: 5: Stand by assistance Extremity Assessment  RLE Assessment RLE Assessment: Within Functional Limits LLE Assessment LLE Assessment: Within Functional Limits    Denise [name], PT, DPT 01/17/2021, 12:26 PM

## 2021-01-17 NOTE — Progress Notes (Signed)
Speech Language Pathology Discharge Summary  Patient Details  Name: Denise [name] MRN: [id number] Date of Birth: 1996-11-19  Today's Date: 01/17/2021 SLP Individual Time: 0730-0755 SLP Individual Time Calculation (min): 25 min   Skilled Therapeutic Interventions:  Pt was seen for skilled ST targeting education with pt and her mother. SLP facilitated session with review of ST goals and progress while inpatient. Pt's mother expressed concern for pt having somewhat labored speech at times. SLP provided education regarding neuro rehabilitation related to speech as well as impact on fatigue on speech. Also answered questions regarding ST follow up, which at least an evaluation at outpatient level is recommended to assess need for further cognitive-linguistic interventions. Pt feels she is back to baseline level of cognitive functioning. Pt also supervised to ambulate around the room; she did not exhibit any losses of balance or impulsivity. Behavior was appropriate, although she did become verbally agitated with mother in one instance when talking about her speech. Pt left sitting edge of bed with alarm set and needs within reach. Continue per current plan of care.   Patient has met 4 of 4 long term goals.  Patient to discharge at overall Supervision level.  Reasons goals not met: n/a   Clinical Impression/Discharge Summary:   Pt made functional gains and met 4 out of 4 long term goals this admission. Pt currently requires Supervision assist for complex tasks due to very mild higher level cognitive deficits, mostly related to executive functioning. As a result, she will require 24/7 supervision at home. Pt has demonstrated improved complex problem solving, recall and carryover of new information and therapeutic strategies, anticipatory awareness, selective attention, and ability to manage stress and impulsivity. However, given mild higher level deficits still present, recommend pt continue to receive  skilled OP ST services upon discharge to assess need for further interventions. Pt and family education is complete at this time.   Care Partner:  Caregiver Able to Provide Assistance: Yes  Type of Caregiver Assistance: Cognitive  Recommendation:  Outpatient SLP;24 hour supervision/assistance  Rationale for SLP Follow Up: Maximize cognitive function and independence;Reduce caregiver burden   Equipment: none   Reasons for discharge: Discharged from hospital   Patient/Family Agrees with Progress Made and Goals Achieved: Yes    Arbutus Leas 01/17/2021, 7:56 AM

## 2021-01-19 ENCOUNTER — Ambulatory Visit: Payer: 59 | Attending: Physician Assistant | Admitting: Occupational Therapy

## 2021-01-19 ENCOUNTER — Other Ambulatory Visit: Payer: Self-pay

## 2021-01-19 ENCOUNTER — Ambulatory Visit: Payer: 59

## 2021-01-19 ENCOUNTER — Encounter: Payer: Self-pay | Admitting: Occupational Therapy

## 2021-01-19 ENCOUNTER — Telehealth: Payer: Self-pay | Admitting: Registered Nurse

## 2021-01-19 DIAGNOSIS — M6281 Muscle weakness (generalized): Secondary | ICD-10-CM | POA: Diagnosis present

## 2021-01-19 DIAGNOSIS — R41840 Attention and concentration deficit: Secondary | ICD-10-CM | POA: Diagnosis present

## 2021-01-19 DIAGNOSIS — R278 Other lack of coordination: Secondary | ICD-10-CM | POA: Insufficient documentation

## 2021-01-19 DIAGNOSIS — M25532 Pain in left wrist: Secondary | ICD-10-CM | POA: Diagnosis present
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
## 2021-01-19 NOTE — Telephone Encounter (Signed)
TC Call Placed, no answer. Left message to return the call.

## 2021-01-20 NOTE — Telephone Encounter (Signed)
Transition Care Management Unsuccessful Follow-up Telephone Call  Date of discharge and from where:  01/17/2021 from Inpatient Rehabilitation  Attempts:  1  Reason for unsuccessful TCM follow-up call:  No return call   

## 2021-01-23 ENCOUNTER — Telehealth: Payer: Self-pay | Admitting: Registered Nurse

## 2021-01-23 ENCOUNTER — Emergency Department (HOSPITAL_COMMUNITY)
Admission: EM | Admit: 2021-01-23 | Discharge: 2021-01-24 | Disposition: A | Discharge status: Home / Self Care | Payer: 59 | Attending: Emergency Medicine | Admitting: Emergency Medicine

## 2021-01-23 ENCOUNTER — Encounter (HOSPITAL_COMMUNITY): Payer: Self-pay | Admitting: Emergency Medicine

## 2021-01-23 ENCOUNTER — Other Ambulatory Visit: Payer: Self-pay

## 2021-01-23 DIAGNOSIS — Z4800 Encounter for change or removal of nonsurgical wound dressing: Secondary | ICD-10-CM | POA: Insufficient documentation

## 2021-01-23 DIAGNOSIS — Z79899 Other long term (current) drug therapy: Secondary | ICD-10-CM | POA: Diagnosis not present

## 2021-01-23 DIAGNOSIS — S199XXD Unspecified injury of neck, subsequent encounter: Secondary | ICD-10-CM | POA: Diagnosis not present

## 2021-01-23 DIAGNOSIS — Z5189 Encounter for other specified aftercare: Secondary | ICD-10-CM

## 2021-01-23 DIAGNOSIS — S0181XD Laceration without foreign body of other part of head, subsequent encounter: Secondary | ICD-10-CM | POA: Diagnosis not present

## 2021-01-23 LAB — CBC WITH DIFFERENTIAL/PLATELET
Abs Immature Granulocytes: 0.04 10*3/uL (ref 0.00–0.07)
Basophils Absolute: 0.1 10*3/uL (ref 0.0–0.1)
Basophils Relative: 1 %
Eosinophils Absolute: 0.3 10*3/uL (ref 0.0–0.5)
Eosinophils Relative: 3 %
HCT: 32 % — ABNORMAL LOW (ref 36.0–46.0)
Hemoglobin: 10 g/dL — ABNORMAL LOW (ref 12.0–15.0)
Immature Granulocytes: 1 %
Lymphocytes Relative: 31 %
Lymphs Abs: 2.7 10*3/uL (ref 0.7–4.0)
MCH: 29.8 pg (ref 26.0–34.0)
MCHC: 31.3 g/dL (ref 30.0–36.0)
MCV: 95.2 fL (ref 80.0–100.0)
Monocytes Absolute: 0.7 10*3/uL (ref 0.1–1.0)
Monocytes Relative: 8 %
Neutro Abs: 4.9 10*3/uL (ref 1.7–7.7)
Neutrophils Relative %: 56 %
Platelets: 377 10*3/uL (ref 150–400)
RBC: 3.36 MIL/uL — ABNORMAL LOW (ref 3.87–5.11)
RDW: 14 % (ref 11.5–15.5)
WBC: 8.6 10*3/uL (ref 4.0–10.5)
nRBC: 0 % (ref 0.0–0.2)

## 2021-01-23 LAB — COMPREHENSIVE METABOLIC PANEL
ALT: 19 U/L (ref 0–44)
AST: 20 U/L (ref 15–41)
Albumin: 3.6 g/dL (ref 3.5–5.0)
Alkaline Phosphatase: 65 U/L (ref 38–126)
Anion gap: 10 (ref 5–15)
BUN: 11 mg/dL (ref 6–20)
CO2: 27 mmol/L (ref 22–32)
Calcium: 9.3 mg/dL (ref 8.9–10.3)
Chloride: 100 mmol/L (ref 98–111)
Creatinine, Ser: 0.54 mg/dL (ref 0.44–1.00)
GFR, Estimated: >60 mL/min (ref >60)
Glucose, Bld: 96 mg/dL (ref 70–99)
Potassium: 3.6 mmol/L (ref 3.5–5.1)
Sodium: 137 mmol/L (ref 135–145)
Total Bilirubin: 0.6 mg/dL (ref 0.3–1.2)
Total Protein: 7.1 g/dL (ref 6.5–8.1)

## 2021-01-23 NOTE — ED Notes (Signed)
Urine held in minilab.
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
## 2021-01-23 NOTE — Telephone Encounter (Signed)
She needs to contact the surgeons about the wound as we discussed at discharge. She can go over to [facility] [employer] for a check of collar fit, if she would like. That would be the best place as they have the tools and parts to make adjustments. thanks

## 2021-01-23 NOTE — Telephone Encounter (Signed)
Patient called regarding head wound bleeding and puss, also neck brace not fitting correctly and hurting her please advise if we move apt or if patient needs to be going to ER.

## 2021-01-24 MED ORDER — CEPHALEXIN 250 MG PO CAPS
500.0000 mg | ORAL_CAPSULE | Freq: Once | ORAL | Status: AC
  Administered 2021-01-24: 500 mg via ORAL
  Filled 2021-01-24: qty 2

## 2021-01-24 MED ORDER — CEPHALEXIN 500 MG PO CAPS: 500.0000 mg | ORAL_CAPSULE | Freq: Four times a day (QID) | ORAL | 0 refills | Status: DC
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
## 2021-01-24 NOTE — Discharge Instructions (Signed)
Continue wound care and dressings changes. Start keflex in the morning-- this has been sent to your pharmacy. Follow-up with your primary care doctor. Return here for any new/acute changes.

## 2021-01-24 NOTE — Telephone Encounter (Signed)
Patient went to ED to have wound checked. See ED note. Contacted patient to advise on neck brace

## 2021-01-25 ENCOUNTER — Encounter: Payer: Self-pay | Admitting: Occupational Therapy

## 2021-01-25 ENCOUNTER — Telehealth: Payer: Self-pay | Admitting: Registered Nurse

## 2021-01-25 ENCOUNTER — Other Ambulatory Visit: Payer: Self-pay

## 2021-01-25 ENCOUNTER — Encounter: Payer: 59 | Admitting: Psychology

## 2021-01-25 ENCOUNTER — Ambulatory Visit: Payer: 59 | Attending: Physician Assistant | Admitting: Occupational Therapy

## 2021-01-25 ENCOUNTER — Ambulatory Visit: Payer: 59 | Admitting: Physical Therapy

## 2021-01-25 DIAGNOSIS — M542 Cervicalgia: Secondary | ICD-10-CM | POA: Diagnosis present

## 2021-01-25 DIAGNOSIS — M25532 Pain in left wrist: Secondary | ICD-10-CM | POA: Insufficient documentation

## 2021-01-25 DIAGNOSIS — R2681 Unsteadiness on feet: Secondary | ICD-10-CM | POA: Diagnosis present

## 2021-01-25 DIAGNOSIS — M6281 Muscle weakness (generalized): Secondary | ICD-10-CM | POA: Insufficient documentation

## 2021-01-25 DIAGNOSIS — R278 Other lack of coordination: Secondary | ICD-10-CM | POA: Insufficient documentation

## 2021-01-25 DIAGNOSIS — R293 Abnormal posture: Secondary | ICD-10-CM | POA: Insufficient documentation

## 2021-01-25 DIAGNOSIS — R41840 Attention and concentration deficit: Secondary | ICD-10-CM | POA: Diagnosis present

## 2021-01-25 NOTE — Telephone Encounter (Signed)
Patient's mom requested to receive  one  phone call today from Jacalyn Lefevre, NP. Her phone number is [phone].

## 2021-01-25 NOTE — Telephone Encounter (Signed)
Return Ms, Lanora Manis call, all questions answered. She verbalizes understanding.
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
## 2021-01-26 ENCOUNTER — Ambulatory Visit (INDEPENDENT_AMBULATORY_CARE_PROVIDER_SITE_OTHER): Payer: 59 | Admitting: Neurology

## 2021-01-26 ENCOUNTER — Encounter: Payer: Self-pay | Admitting: Neurology

## 2021-01-26 VITALS — BP 96/65 | HR 84 | Ht 63.0 in | Wt 109.0 lb

## 2021-01-26 DIAGNOSIS — R4189 Other symptoms and signs involving cognitive functions and awareness: Secondary | ICD-10-CM

## 2021-01-26 DIAGNOSIS — S199XXA Unspecified injury of neck, initial encounter: Secondary | ICD-10-CM | POA: Insufficient documentation

## 2021-01-26 DIAGNOSIS — S069X3S Unspecified intracranial injury with loss of consciousness of 1 hour to 5 hours 59 minutes, sequela: Secondary | ICD-10-CM | POA: Diagnosis not present

## 2021-01-26 DIAGNOSIS — R9089 Other abnormal findings on diagnostic imaging of central nervous system: Secondary | ICD-10-CM | POA: Diagnosis not present

## 2021-01-26 DIAGNOSIS — E559 Vitamin D deficiency, unspecified: Secondary | ICD-10-CM | POA: Insufficient documentation

## 2021-01-26 DIAGNOSIS — S199XXS Unspecified injury of neck, sequela: Secondary | ICD-10-CM | POA: Diagnosis not present
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
## 2021-01-27 ENCOUNTER — Telehealth: Payer: Self-pay | Admitting: *Deleted

## 2021-01-27 ENCOUNTER — Encounter: Payer: 59 | Admitting: Registered Nurse

## 2021-01-27 LAB — PAN-ANCA
ANCA Proteinase 3: <3.5 U/mL (ref 0.0–3.5)
Atypical pANCA: <1:20 {titer}
C-ANCA: <1:20 {titer}
Myeloperoxidase Ab: <9 U/mL (ref 0.0–9.0)
P-ANCA: <1:20 {titer}

## 2021-01-27 LAB — ANA W/REFLEX: Anti Nuclear Antibody (ANA): NEGATIVE

## 2021-01-27 LAB — SEDIMENTATION RATE: Sed Rate: 7 mm/hr (ref 0–32)

## 2021-01-27 LAB — VITAMIN D 25 HYDROXY (VIT D DEFICIENCY, FRACTURES): Vit D, 25-Hydroxy: 29.5 ng/mL — ABNORMAL LOW (ref 30.0–100.0)

## 2021-01-27 MED ORDER — LORAZEPAM 1 MG PO TABS: 1.0000 mg | ORAL_TABLET | Freq: Three times a day (TID) | ORAL | 0 refills | Status: DC

## 2021-01-27 NOTE — Telephone Encounter (Signed)
Denise [name]'s mother called and is requesting her lorazepam be refilled. Please advise.

## 2021-01-27 NOTE — Telephone Encounter (Signed)
Left message with mother's home number that medication has been sent in.

## 2021-01-30 ENCOUNTER — Telehealth: Payer: Self-pay | Admitting: Neurology

## 2021-01-30 ENCOUNTER — Telehealth: Payer: Self-pay | Admitting: Cardiovascular Disease

## 2021-01-30 ENCOUNTER — Telehealth: Payer: Self-pay | Admitting: *Deleted

## 2021-01-30 NOTE — Telephone Encounter (Signed)
       Went in pt's chart to see what discharge summary said about seeing a Cardiologist

## 2021-01-30 NOTE — Telephone Encounter (Signed)
[facility] auth: [id number] (exp. 01/30/21 to 02/28/21) order sent to GI. They will reach out to the patient to schedule.

## 2021-01-30 NOTE — Telephone Encounter (Signed)
Mrs Pancoast called for a refill on propanolol for Denise [name].  She is out today.  Looks like [name] wrote the Rx at discharge with 2 week supply.

## 2021-01-31 ENCOUNTER — Encounter: Payer: Self-pay | Admitting: Physical Therapy

## 2021-01-31 ENCOUNTER — Ambulatory Visit: Payer: 59 | Admitting: Physical Therapy

## 2021-01-31 ENCOUNTER — Ambulatory Visit: Payer: MEDICAID | Admitting: Neurology

## 2021-01-31 ENCOUNTER — Telehealth: Payer: Self-pay | Admitting: *Deleted

## 2021-01-31 ENCOUNTER — Ambulatory Visit: Payer: 59 | Admitting: Occupational Therapy

## 2021-01-31 ENCOUNTER — Other Ambulatory Visit: Payer: Self-pay

## 2021-01-31 ENCOUNTER — Encounter: Payer: Self-pay | Admitting: Occupational Therapy

## 2021-01-31 DIAGNOSIS — M542 Cervicalgia: Secondary | ICD-10-CM

## 2021-01-31 DIAGNOSIS — R2681 Unsteadiness on feet: Secondary | ICD-10-CM

## 2021-01-31 DIAGNOSIS — R278 Other lack of coordination: Secondary | ICD-10-CM

## 2021-01-31 DIAGNOSIS — R293 Abnormal posture: Secondary | ICD-10-CM

## 2021-01-31 DIAGNOSIS — M25532 Pain in left wrist: Secondary | ICD-10-CM

## 2021-01-31 DIAGNOSIS — M6281 Muscle weakness (generalized): Secondary | ICD-10-CM

## 2021-01-31 DIAGNOSIS — R41840 Attention and concentration deficit: Secondary | ICD-10-CM

## 2021-01-31 MED ORDER — LORAZEPAM 1 MG PO TABS: 1.0000 mg | ORAL_TABLET | Freq: Three times a day (TID) | ORAL | 0 refills | Status: DC

## 2021-01-31 MED ORDER — PROPRANOLOL HCL 20 MG PO TABS: 20.0000 mg | ORAL_TABLET | Freq: Three times a day (TID) | ORAL | 3 refills | Status: DC
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
## 2021-01-31 NOTE — Telephone Encounter (Signed)
I wrote a new ativan rx to start 2/18. I decreased propranolol to 20mg , and changed it to 20mg  tablet TID with 3RF

## 2021-01-31 NOTE — Telephone Encounter (Signed)
Mom and patient notified. Mom states patient has been out of Lorazepam for a few days. I called pharmacy and spoke to [facility] and she states prescription for #45 of Lorazepam has been at pharmacy since 01/27/21. Called and notified mom.
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
## 2021-02-01 ENCOUNTER — Other Ambulatory Visit: Payer: Self-pay

## 2021-02-01 ENCOUNTER — Telehealth: Payer: Self-pay | Admitting: *Deleted

## 2021-02-01 ENCOUNTER — Encounter: Payer: Self-pay | Admitting: Registered Nurse

## 2021-02-01 ENCOUNTER — Encounter: Payer: 59 | Attending: Registered Nurse | Admitting: Registered Nurse

## 2021-02-01 DIAGNOSIS — R000 Tachycardia, unspecified: Secondary | ICD-10-CM | POA: Diagnosis not present

## 2021-02-01 DIAGNOSIS — S14103D Unspecified injury at C3 level of cervical spinal cord, subsequent encounter: Secondary | ICD-10-CM | POA: Insufficient documentation

## 2021-02-01 DIAGNOSIS — S069X0D Unspecified intracranial injury without loss of consciousness, subsequent encounter: Secondary | ICD-10-CM | POA: Insufficient documentation

## 2021-02-01 DIAGNOSIS — F5101 Primary insomnia: Secondary | ICD-10-CM | POA: Diagnosis not present

## 2021-02-01 DIAGNOSIS — F902 Attention-deficit hyperactivity disorder, combined type: Secondary | ICD-10-CM | POA: Diagnosis not present

## 2021-02-01 DIAGNOSIS — S52615D Nondisplaced fracture of left ulna styloid process, subsequent encounter for closed fracture with routine healing: Secondary | ICD-10-CM | POA: Insufficient documentation

## 2021-02-01 DIAGNOSIS — S069X3S Unspecified intracranial injury with loss of consciousness of 1 hour to 5 hours 59 minutes, sequela: Secondary | ICD-10-CM | POA: Diagnosis not present

## 2021-02-01 DIAGNOSIS — E232 Diabetes insipidus: Secondary | ICD-10-CM | POA: Insufficient documentation
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
## 2021-02-01 NOTE — Telephone Encounter (Signed)
I reviewed Dr. Erin Hearing note. He felt her tachycardia was physiological because of anemia, pain, TBI. Her ECHO was normal as well. No cardiac f/u indicated. I believe [name] made a mistake here. My apologies for the confusion.
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
## 2021-02-01 NOTE — Telephone Encounter (Signed)
Mrs Kulinski called and said that Haizel's discharge papers say for her to call cardiology for an appt  Croitoru, Rachelle Hora, MD Follow up.   Specialty: Cardiology Why: Call for appointment Contact information: [address] [place] [address] [phone] She called but the office did not know anything about [place] and told her that she would require a referral.  Please advise.

## 2021-02-02 ENCOUNTER — Ambulatory Visit: Payer: 59 | Admitting: Psychology

## 2021-02-02 ENCOUNTER — Encounter: Payer: Self-pay | Admitting: Psychology

## 2021-02-02 ENCOUNTER — Encounter: Payer: 59 | Admitting: Psychology

## 2021-02-02 DIAGNOSIS — F902 Attention-deficit hyperactivity disorder, combined type: Secondary | ICD-10-CM

## 2021-02-02 DIAGNOSIS — S069X0D Unspecified intracranial injury without loss of consciousness, subsequent encounter: Secondary | ICD-10-CM

## 2021-02-02 NOTE — Addendum Note (Signed)
Addended by: Drake Leach on: 02/02/2021 05:40 PM   Modules accepted: Orders
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
## 2021-02-07 ENCOUNTER — Encounter: Payer: Self-pay | Admitting: Registered Nurse

## 2021-02-07 ENCOUNTER — Encounter: Payer: Self-pay | Admitting: Vascular Surgery

## 2021-02-07 ENCOUNTER — Other Ambulatory Visit: Payer: Self-pay

## 2021-02-07 ENCOUNTER — Ambulatory Visit (INDEPENDENT_AMBULATORY_CARE_PROVIDER_SITE_OTHER): Payer: 59 | Admitting: Vascular Surgery

## 2021-02-07 VITALS — BP 107/72 | HR 92 | Temp 98.2°F | Resp 20 | Ht 63.0 in | Wt 109.0 lb

## 2021-02-07 DIAGNOSIS — Z87828 Personal history of other (healed) physical injury and trauma: Secondary | ICD-10-CM | POA: Diagnosis not present
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
## 2021-02-08 ENCOUNTER — Encounter: Payer: Self-pay | Admitting: Physical Therapy

## 2021-02-08 ENCOUNTER — Other Ambulatory Visit: Payer: Self-pay | Admitting: Physical Medicine & Rehabilitation

## 2021-02-08 ENCOUNTER — Telehealth: Payer: Self-pay | Admitting: Neurology

## 2021-02-08 ENCOUNTER — Ambulatory Visit: Payer: 59 | Admitting: Occupational Therapy

## 2021-02-08 ENCOUNTER — Encounter: Payer: Self-pay | Admitting: Occupational Therapy

## 2021-02-08 ENCOUNTER — Ambulatory Visit: Payer: 59 | Admitting: Physical Therapy

## 2021-02-08 DIAGNOSIS — R278 Other lack of coordination: Secondary | ICD-10-CM | POA: Diagnosis not present

## 2021-02-08 DIAGNOSIS — M6281 Muscle weakness (generalized): Secondary | ICD-10-CM

## 2021-02-08 DIAGNOSIS — R41840 Attention and concentration deficit: Secondary | ICD-10-CM

## 2021-02-08 DIAGNOSIS — M25532 Pain in left wrist: Secondary | ICD-10-CM

## 2021-02-08 DIAGNOSIS — R2681 Unsteadiness on feet: Secondary | ICD-10-CM

## 2021-02-08 NOTE — Patient Instructions (Signed)
AROM: Wrist Extension   .  With _left___ palm down, bend wrist up. Repeat __15__ times per set.  Do __3__ sessions per day.    AROM: Wrist Flexion    AROM: Forearm Pronation / Supination   With _left___ arm in handshake position, slowly rotate palm down until stretch is felt. Relax. Then rotate palm up until stretch is felt. Repeat _15___ times per set. Do _3__ sessions per day.  Copyright  VHI. All rights reserved.
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
## 2021-02-08 NOTE — Telephone Encounter (Signed)
Pt's mother called stating that the MRI office called them and only had availability for after the office visit she is scheduled for. Pt's mother would like to know if she can be seen after the first week of March. Pt was informed that the provider did not have anything available in March and she stated she would like the RN to call her. Please advise.

## 2021-02-08 NOTE — Telephone Encounter (Signed)
Reviewed pt chart. She has next follow up with Dr. Epimenio Foot on 02/20/21 at 3pm and MRI brain scheduled for 02/22/21 at 4:50pm

## 2021-02-08 NOTE — Patient Instructions (Addendum)
Access Code: [email or web address] URL: [email or web address] Date: 02/08/2021 Prepared by: Lonia Blood  Exercises Seated Hamstring Stretch - 1 x daily - 5 x weekly - 1 sets - 3 reps - 30 sec hold Standing Balance in Corner with Eyes Closed - 1 x daily - 5 x weekly - 1 sets - 3 reps - 30 sec hold Romberg Stance Eyes Closed on Foam Pad - 1 x daily - 5 x weekly - 1 sets - 3 reps - 30 sec hold  Discussed and demo gentle neck flexion/extension and sidebending motions-advised pt to perform gently with activities through the day and avoid excessive movements

## 2021-02-08 NOTE — Telephone Encounter (Signed)
Called mother, offered appt on 02/27/21. She declined, has to take her mother to appt that day. Scheduled appt for 03/02/21 at 1:30pm with Dr. Epimenio Foot. Offered other various dates/times at 9 or 1:30pm same week but mother declined.

## 2021-02-08 NOTE — Telephone Encounter (Signed)
Spoke with Dr. Epimenio Foot. Ok to r/s appt to 02/27/21 at [facility]
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
## 2021-02-10 ENCOUNTER — Telehealth: Payer: Self-pay | Admitting: *Deleted

## 2021-02-10 MED ORDER — TRAZODONE HCL 100 MG PO TABS: 100.0000 mg | ORAL_TABLET | Freq: Every day | ORAL | 3 refills | Status: DC

## 2021-02-10 NOTE — Telephone Encounter (Signed)
Notified. 

## 2021-02-10 NOTE — Telephone Encounter (Signed)
RF sent.

## 2021-02-10 NOTE — Telephone Encounter (Signed)
Alzena's mother called for a refill on her trazodone.

## 2021-02-13 ENCOUNTER — Ambulatory Visit: Payer: 59 | Admitting: Psychology

## 2021-02-14 ENCOUNTER — Ambulatory Visit: Payer: 59 | Admitting: Physical Therapy

## 2021-02-14 ENCOUNTER — Encounter: Payer: Self-pay | Admitting: Physical Therapy

## 2021-02-14 ENCOUNTER — Encounter: Payer: Self-pay | Admitting: Occupational Therapy

## 2021-02-14 ENCOUNTER — Ambulatory Visit: Payer: 59 | Admitting: Occupational Therapy

## 2021-02-14 ENCOUNTER — Telehealth: Payer: Self-pay

## 2021-02-14 ENCOUNTER — Other Ambulatory Visit: Payer: Self-pay

## 2021-02-14 DIAGNOSIS — E232 Diabetes insipidus: Secondary | ICD-10-CM

## 2021-02-14 DIAGNOSIS — R2681 Unsteadiness on feet: Secondary | ICD-10-CM

## 2021-02-14 DIAGNOSIS — M542 Cervicalgia: Secondary | ICD-10-CM

## 2021-02-14 DIAGNOSIS — M25532 Pain in left wrist: Secondary | ICD-10-CM

## 2021-02-14 DIAGNOSIS — M6281 Muscle weakness (generalized): Secondary | ICD-10-CM

## 2021-02-14 DIAGNOSIS — R278 Other lack of coordination: Secondary | ICD-10-CM

## 2021-02-14 DIAGNOSIS — R293 Abnormal posture: Secondary | ICD-10-CM

## 2021-02-14 DIAGNOSIS — R41840 Attention and concentration deficit: Secondary | ICD-10-CM

## 2021-02-14 MED ORDER — DESMOPRESSIN ACETATE 0.1 MG PO TABS: 0.1000 mg | ORAL_TABLET | Freq: Two times a day (BID) | ORAL | 2 refills | Status: DC

## 2021-02-14 NOTE — Patient Instructions (Signed)
11. Grip Strengthening (Resistive Putty)   Squeeze putty using thumb and all fingers. Repeat 15 times. Do 1-2 sessions per day.   Extension (Assistive Putty)   Roll putty back and forth, being sure to use all fingertips. Repeat 3 times. Do 1-2 sessions per day.  Then pinch as below.   Palmar Pinch Strengthening (Resistive Putty)   Pinch putty between thumb and each fingertip in turn after rolling out       MP Flexion (Resistive Putty)   Bending only at large knuckles, press putty down against thumb. Keep fingertips straight. Repeat 10 times. Do 1-2 sessions per day.          FINGERS: Extension (Putty)    Open hand and fingers to flatten putty. ___ reps per set, ___ sets per day, ___ days per week       FINGERS: Intrinsic (Putty)    Flatten putty by separating fingers. Keep fingers straight. ___ reps per set, ___ sets per day, ___ days per week    Pinch putty between two hands - pull apart.

## 2021-02-14 NOTE — Telephone Encounter (Signed)
Mekhi needs follow up labs including BMET, serum osmolality, and urine osmolality. I have ordered all these for "lab collect". Have her go to [facility] to for collection.   I sent a refill for the DDAVP. Please ask mom to make sure she's actually taking this and not discarding it/spitting out/etc. 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 I would not expect it to be worsening at this point.   thx
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
## 2021-02-14 NOTE — Telephone Encounter (Signed)
Per Mother: Denise [name] needs a refill of Desmopressin  0.1 mg. Also she is having extreme thirst and up to void 6-8 times per night.   Patient does not see her PCP until next week. Please advise.

## 2021-02-15 ENCOUNTER — Other Ambulatory Visit: Payer: Self-pay

## 2021-02-15 ENCOUNTER — Encounter: Payer: 59 | Admitting: Psychology

## 2021-02-15 DIAGNOSIS — R454 Irritability and anger: Secondary | ICD-10-CM | POA: Diagnosis not present

## 2021-02-15 DIAGNOSIS — S069X0D Unspecified intracranial injury without loss of consciousness, subsequent encounter: Secondary | ICD-10-CM | POA: Diagnosis not present
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
## 2021-02-15 NOTE — Telephone Encounter (Signed)
Task completed

## 2021-02-16 ENCOUNTER — Ambulatory Visit: Payer: 59

## 2021-02-17 ENCOUNTER — Telehealth: Payer: Self-pay | Admitting: *Deleted

## 2021-02-17 LAB — BASIC METABOLIC PANEL
BUN/Creatinine Ratio: 20 (ref 9–23)
BUN: 11 mg/dL (ref 6–20)
CO2: 21 mmol/L (ref 20–29)
Calcium: 9.5 mg/dL (ref 8.7–10.2)
Chloride: 104 mmol/L (ref 96–106)
Creatinine, Ser: 0.55 mg/dL — ABNORMAL LOW (ref 0.57–1.00)
GFR calc Af Amer: 152 mL/min/{1.73_m2} (ref >59)
GFR calc non Af Amer: 132 mL/min/{1.73_m2} (ref >59)
Glucose: 88 mg/dL (ref 65–99)
Potassium: 4.3 mmol/L (ref 3.5–5.2)
Sodium: 142 mmol/L (ref 134–144)

## 2021-02-17 LAB — OSMOLALITY, URINE: Osmolality, Ur: 106 mOsmol/kg

## 2021-02-17 LAB — OSMOLALITY: Osmolality Meas: 291 mOsmol/kg (ref 275–295)

## 2021-02-17 NOTE — Telephone Encounter (Signed)
Denise [name] called and says they have the lab results in mychart but she would like an explanation of what they mean.

## 2021-02-20 ENCOUNTER — Ambulatory Visit: Payer: MEDICAID | Admitting: Neurology

## 2021-02-20 ENCOUNTER — Telehealth: Payer: Self-pay | Admitting: Neurology

## 2021-02-20 ENCOUNTER — Telehealth: Payer: Self-pay | Admitting: *Deleted

## 2021-02-20 MED ORDER — DESMOPRESSIN ACETATE 0.1 MG PO TABS: 0.1500 mg | ORAL_TABLET | Freq: Two times a day (BID) | ORAL | 2 refills | Status: DC
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
## 2021-02-20 NOTE — Telephone Encounter (Signed)
Spoke with Dr. Epimenio Foot. He is unsure what it would be if it is just drooping. If she also has diplopia, he would need to see her in the office. She has MRI scheduled for 02/22/21 and will let them know results as soon as they come back.

## 2021-02-20 NOTE — Telephone Encounter (Signed)
Mrs Graver called and reports that she is concerned about Dawson having a drooping left eye. It seems that it droops more towards the end of the day and the eye becomes very small. I called back and she has talked with Dr Riley Kill this morning and they discussed. She will mention to Dr Epimenio Foot as well.

## 2021-02-20 NOTE — Telephone Encounter (Signed)
Pt's mother(on DPR) has called to report her noticing pt's left eye drooping down, and getting smaller.  Pt's mother mentioned the eyebrow too.  Pt's mother states she notices this more in the afternoon and evening and wanted to know of any suggestions Dr Epimenio Foot may have.  Please call

## 2021-02-20 NOTE — Telephone Encounter (Signed)
LVM returning mother's call.

## 2021-02-20 NOTE — Telephone Encounter (Signed)
Called mom and discussed labs. Still might be suffering from DI based on labs which are borderline. Adjusted to 0.15mg  bid

## 2021-02-21 ENCOUNTER — Encounter: Payer: Self-pay | Admitting: Physical Therapy

## 2021-02-21 ENCOUNTER — Other Ambulatory Visit: Payer: Self-pay

## 2021-02-21 ENCOUNTER — Ambulatory Visit: Payer: 59 | Admitting: Occupational Therapy

## 2021-02-21 ENCOUNTER — Ambulatory Visit: Payer: 59 | Admitting: Physical Therapy

## 2021-02-21 DIAGNOSIS — R41840 Attention and concentration deficit: Secondary | ICD-10-CM

## 2021-02-21 DIAGNOSIS — R278 Other lack of coordination: Secondary | ICD-10-CM

## 2021-02-21 DIAGNOSIS — R293 Abnormal posture: Secondary | ICD-10-CM | POA: Insufficient documentation

## 2021-02-21 DIAGNOSIS — R41841 Cognitive communication deficit: Secondary | ICD-10-CM | POA: Insufficient documentation

## 2021-02-21 DIAGNOSIS — M25532 Pain in left wrist: Secondary | ICD-10-CM | POA: Insufficient documentation

## 2021-02-21 DIAGNOSIS — S069X3S Unspecified intracranial injury with loss of consciousness of 1 hour to 5 hours 59 minutes, sequela: Secondary | ICD-10-CM | POA: Diagnosis present

## 2021-02-21 DIAGNOSIS — R9082 White matter disease, unspecified: Secondary | ICD-10-CM | POA: Diagnosis present

## 2021-02-21 DIAGNOSIS — R2681 Unsteadiness on feet: Secondary | ICD-10-CM | POA: Insufficient documentation

## 2021-02-21 DIAGNOSIS — M6281 Muscle weakness (generalized): Secondary | ICD-10-CM | POA: Insufficient documentation

## 2021-02-21 DIAGNOSIS — E232 Diabetes insipidus: Secondary | ICD-10-CM | POA: Diagnosis present

## 2021-02-21 DIAGNOSIS — S069X0D Unspecified intracranial injury without loss of consciousness, subsequent encounter: Secondary | ICD-10-CM | POA: Diagnosis present

## 2021-02-21 DIAGNOSIS — F329 Major depressive disorder, single episode, unspecified: Secondary | ICD-10-CM | POA: Diagnosis present

## 2021-02-21 NOTE — Patient Instructions (Addendum)
Access Code: [email or web address] URL: [email or web address] Date: 02/21/2021 Prepared by: Lonia Blood  Exercises Seated Hamstring Stretch - 1 x daily - 5 x weekly - 1 sets - 3 reps - 30 sec hold Standing Balance in Corner with Eyes Closed - 1 x daily - 5 x weekly - 1 sets - 3 reps - 30 sec hold Romberg Stance Eyes Closed on Foam Pad - 1 x daily - 5 x weekly - 1 sets - 3 reps - 30 sec hold  Added 02/21/2021: Single Leg Stance with Support - 1 x daily - 5 x weekly - 1 sets - 3 reps - 10 sec hold
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
## 2021-02-22 ENCOUNTER — Ambulatory Visit
Admission: RE | Admit: 2021-02-22 | Discharge: 2021-02-22 | Disposition: A | Discharge status: Home / Self Care | Payer: 59 | Source: Ambulatory Visit | Attending: Neurology | Admitting: Neurology

## 2021-02-22 DIAGNOSIS — R9089 Other abnormal findings on diagnostic imaging of central nervous system: Secondary | ICD-10-CM

## 2021-02-22 DIAGNOSIS — S069X3S Unspecified intracranial injury with loss of consciousness of 1 hour to 5 hours 59 minutes, sequela: Secondary | ICD-10-CM

## 2021-02-22 MED ORDER — GADOBENATE DIMEGLUMINE 529 MG/ML IV SOLN
11.0000 mL | Freq: Once | INTRAVENOUS | Status: AC | PRN
  Administered 2021-02-22: 11 mL via INTRAVENOUS

## 2021-02-22 NOTE — Telephone Encounter (Signed)
Dr. Epimenio Foot- FYI Called pt mother back. She did not receive my voicemail. Relayed Dr. Bonnita Hollow message. Droopiness is intermittent, denies any vision problems. Getting MRI's completed now. Advised we will call if results show anything concerning. If not, he will go over MRI results at appt on 03/02/21. She verbalized understanding.

## 2021-02-23 ENCOUNTER — Ambulatory Visit: Payer: 59 | Admitting: Physical Therapy

## 2021-02-23 ENCOUNTER — Ambulatory Visit: Payer: 59 | Admitting: Occupational Therapy

## 2021-02-23 ENCOUNTER — Telehealth: Payer: Self-pay | Admitting: Neurology

## 2021-02-23 NOTE — Telephone Encounter (Signed)
Spoke with her mother and went over the results of the MRI.  It is consistent with MS.  She is already scheduled to come in to see me in a week.  Therefore she should keep that appointment so we can go over different disease modifying therapy options.

## 2021-02-26 ENCOUNTER — Encounter: Payer: Self-pay | Admitting: Psychology

## 2021-02-28 ENCOUNTER — Encounter: Payer: Self-pay | Admitting: Physical Therapy

## 2021-02-28 ENCOUNTER — Other Ambulatory Visit: Payer: Self-pay

## 2021-02-28 ENCOUNTER — Ambulatory Visit: Payer: 59

## 2021-02-28 ENCOUNTER — Ambulatory Visit: Payer: 59 | Admitting: Occupational Therapy

## 2021-02-28 ENCOUNTER — Ambulatory Visit: Payer: 59 | Admitting: Physical Therapy

## 2021-02-28 ENCOUNTER — Encounter: Payer: Self-pay | Admitting: Occupational Therapy

## 2021-02-28 DIAGNOSIS — M6281 Muscle weakness (generalized): Secondary | ICD-10-CM

## 2021-02-28 DIAGNOSIS — R2681 Unsteadiness on feet: Secondary | ICD-10-CM

## 2021-02-28 DIAGNOSIS — R293 Abnormal posture: Secondary | ICD-10-CM

## 2021-02-28 DIAGNOSIS — R278 Other lack of coordination: Secondary | ICD-10-CM

## 2021-02-28 DIAGNOSIS — M25532 Pain in left wrist: Secondary | ICD-10-CM

## 2021-02-28 DIAGNOSIS — R41841 Cognitive communication deficit: Secondary | ICD-10-CM

## 2021-02-28 DIAGNOSIS — S069X0D Unspecified intracranial injury without loss of consciousness, subsequent encounter: Secondary | ICD-10-CM | POA: Diagnosis not present

## 2021-02-28 DIAGNOSIS — R41840 Attention and concentration deficit: Secondary | ICD-10-CM

## 2021-02-28 NOTE — Patient Instructions (Addendum)
  App- [employer] The app is free for 14 days - after that it will ask you for a credit/debit card  Focus on attention/concentration, listening, and organization and planning
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
## 2021-03-02 ENCOUNTER — Encounter: Payer: 59 | Admitting: Occupational Therapy

## 2021-03-02 ENCOUNTER — Encounter: Payer: Self-pay | Admitting: Neurology

## 2021-03-02 ENCOUNTER — Ambulatory Visit: Payer: 59 | Admitting: Physical Therapy

## 2021-03-02 ENCOUNTER — Ambulatory Visit: Payer: 59 | Admitting: Neurology

## 2021-03-02 VITALS — BP 95/70 | HR 98 | Ht 63.0 in | Wt 116.0 lb

## 2021-03-02 DIAGNOSIS — R9089 Other abnormal findings on diagnostic imaging of central nervous system: Secondary | ICD-10-CM | POA: Diagnosis not present

## 2021-03-02 DIAGNOSIS — R9082 White matter disease, unspecified: Secondary | ICD-10-CM | POA: Diagnosis not present

## 2021-03-02 DIAGNOSIS — R930 Abnormal findings on diagnostic imaging of skull and head, not elsewhere classified: Secondary | ICD-10-CM

## 2021-03-02 DIAGNOSIS — S069X3S Unspecified intracranial injury with loss of consciousness of 1 hour to 5 hours 59 minutes, sequela: Secondary | ICD-10-CM | POA: Diagnosis not present
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
## 2021-03-07 ENCOUNTER — Ambulatory Visit: Payer: 59 | Admitting: Physical Therapy

## 2021-03-07 ENCOUNTER — Other Ambulatory Visit: Payer: Self-pay

## 2021-03-07 ENCOUNTER — Encounter: Payer: 59 | Attending: Registered Nurse | Admitting: Psychology

## 2021-03-07 ENCOUNTER — Ambulatory Visit: Payer: 59 | Admitting: Occupational Therapy

## 2021-03-07 DIAGNOSIS — R9082 White matter disease, unspecified: Secondary | ICD-10-CM | POA: Insufficient documentation

## 2021-03-07 DIAGNOSIS — S069X0D Unspecified intracranial injury without loss of consciousness, subsequent encounter: Secondary | ICD-10-CM | POA: Diagnosis not present

## 2021-03-07 DIAGNOSIS — S069X3S Unspecified intracranial injury with loss of consciousness of 1 hour to 5 hours 59 minutes, sequela: Secondary | ICD-10-CM | POA: Insufficient documentation

## 2021-03-07 DIAGNOSIS — E232 Diabetes insipidus: Secondary | ICD-10-CM | POA: Insufficient documentation

## 2021-03-07 DIAGNOSIS — R454 Irritability and anger: Secondary | ICD-10-CM | POA: Diagnosis not present

## 2021-03-07 DIAGNOSIS — F902 Attention-deficit hyperactivity disorder, combined type: Secondary | ICD-10-CM

## 2021-03-07 DIAGNOSIS — F329 Major depressive disorder, single episode, unspecified: Secondary | ICD-10-CM | POA: Insufficient documentation

## 2021-03-07 NOTE — Progress Notes (Deleted)
To be included in chart by 03/20/21

## 2021-03-08 ENCOUNTER — Ambulatory Visit: Payer: 59 | Admitting: Physical Therapy

## 2021-03-08 ENCOUNTER — Encounter: Payer: Self-pay | Admitting: Physical Therapy

## 2021-03-08 ENCOUNTER — Ambulatory Visit: Payer: 59

## 2021-03-08 DIAGNOSIS — M6281 Muscle weakness (generalized): Secondary | ICD-10-CM

## 2021-03-08 DIAGNOSIS — R41840 Attention and concentration deficit: Secondary | ICD-10-CM

## 2021-03-08 DIAGNOSIS — S069X0D Unspecified intracranial injury without loss of consciousness, subsequent encounter: Secondary | ICD-10-CM | POA: Diagnosis not present

## 2021-03-08 DIAGNOSIS — R2681 Unsteadiness on feet: Secondary | ICD-10-CM

## 2021-03-08 DIAGNOSIS — R41841 Cognitive communication deficit: Secondary | ICD-10-CM

## 2021-03-08 NOTE — Patient Instructions (Signed)
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
## 2021-03-08 NOTE — Patient Instructions (Addendum)
Access Code: [email or web address] URL: [email or web address] Date: 03/08/2021 Prepared by: Lonia Blood  Exercises Seated Hamstring Stretch - 1 x daily - 5 x weekly - 1 sets - 3 reps - 30 sec hold Romberg Stance Eyes Closed on Foam Pad - 1 x daily - 5 x weekly - 2 sets - 5 reps Single Leg Stance with Support - 1 x daily - 5 x weekly - 1 sets - 3 reps - 10 sec hold  Added 03/08/2021: Tandem Stance - 1 x daily - 5 x weekly - 1 sets - 5 reps Single Leg Heel Raise with Counter Support - 1 x daily - 5 x weekly - 2 sets - 10 reps Toe Walking with Counter Support - 1 x daily - 5 x weekly - 1 sets - 3 reps
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
## 2021-03-09 ENCOUNTER — Ambulatory Visit: Payer: 59 | Admitting: Occupational Therapy

## 2021-03-09 ENCOUNTER — Ambulatory Visit: Payer: 59 | Admitting: Physical Therapy

## 2021-03-11 ENCOUNTER — Other Ambulatory Visit: Payer: Self-pay | Admitting: Physical Medicine & Rehabilitation

## 2021-03-14 ENCOUNTER — Encounter: Payer: 59 | Admitting: Occupational Therapy

## 2021-03-14 ENCOUNTER — Ambulatory Visit: Payer: 59 | Admitting: Physical Therapy

## 2021-03-15 ENCOUNTER — Ambulatory Visit: Payer: 59

## 2021-03-15 ENCOUNTER — Ambulatory Visit: Payer: 59 | Admitting: Physical Therapy

## 2021-03-15 ENCOUNTER — Other Ambulatory Visit: Payer: Self-pay

## 2021-03-15 DIAGNOSIS — M6281 Muscle weakness (generalized): Secondary | ICD-10-CM

## 2021-03-15 DIAGNOSIS — R41840 Attention and concentration deficit: Secondary | ICD-10-CM

## 2021-03-15 DIAGNOSIS — R41841 Cognitive communication deficit: Secondary | ICD-10-CM

## 2021-03-15 DIAGNOSIS — R2681 Unsteadiness on feet: Secondary | ICD-10-CM

## 2021-03-15 DIAGNOSIS — S069X0D Unspecified intracranial injury without loss of consciousness, subsequent encounter: Secondary | ICD-10-CM | POA: Diagnosis not present
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
## 2021-03-15 NOTE — Patient Instructions (Addendum)
   Review restaurant menu to re-familiarize self. Practice any skills you would use at work to ensure you can remember how to do them and do them correctly  Write down any incidences where you notice your thinking has changed.    Ask your mom about any episodes where you weren't aware of your errors (I.e., clocking drawing).
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
## 2021-03-16 ENCOUNTER — Ambulatory Visit: Payer: 59 | Admitting: Occupational Therapy

## 2021-03-16 ENCOUNTER — Ambulatory Visit: Payer: 59 | Admitting: Physical Therapy

## 2021-03-16 ENCOUNTER — Telehealth: Payer: Self-pay | Admitting: Neurology

## 2021-03-16 DIAGNOSIS — R9089 Other abnormal findings on diagnostic imaging of central nervous system: Secondary | ICD-10-CM

## 2021-03-16 DIAGNOSIS — R9082 White matter disease, unspecified: Secondary | ICD-10-CM

## 2021-03-16 DIAGNOSIS — R4189 Other symptoms and signs involving cognitive functions and awareness: Secondary | ICD-10-CM

## 2021-03-16 NOTE — Telephone Encounter (Signed)
I would recommend that she go ahead with the lumbar puncture because if it is abnormal I would want to get her started on a medicine sooner rather than later.  However, if she does not want to do the lumbar puncture we will recheck an MRI around August or so

## 2021-03-16 NOTE — Telephone Encounter (Signed)
Pt 's mother(on DPR) has concern about pt's right leg dragging, mother wants to know if this is MS related

## 2021-03-16 NOTE — Telephone Encounter (Signed)
Called and spoke with mother. She reports intermittent episodes where her daughter's right leg will drag. Occurs towards evening time/when she is tired,stands a lot or had a busy day. Occuring 1-2 times a week. I did advise muscle fatigue/weakness can be present with MS.  I did ask if they have decided whether to move forward with LP or do repeat MRI in 6 months. She states when she tries to discuss w/ daughter, she gets tearful and states she does not want to talk about it. Mother wants to know if MD recommends LP at this point given sx.   She also reports that her daughter's left leg had a hard lump in her thigh. States daughter noticed this a couple weeks ago. No pain associated with lump. She addressed with physical therapist and told to f/u with Dr. Riley Kill on this.

## 2021-03-16 NOTE — Telephone Encounter (Signed)
Called pt mother back. Relayed Dr. Bonnita Hollow message. She verbalized understanding. She is going to discuss things with daughter. She will call back if they want to proceed with LP so we can place order.

## 2021-03-17 ENCOUNTER — Encounter: Payer: Self-pay | Admitting: Psychology

## 2021-03-17 ENCOUNTER — Telehealth: Payer: Self-pay | Admitting: Physical Medicine & Rehabilitation

## 2021-03-17 NOTE — Telephone Encounter (Signed)
Has a lump on her left leg on outside of thigh mother is concerned and would like to follow up with Dr Riley Kill about this issue has contacted Neurologist regarding dragging her right foot and has asked PT about the Left Thigh they recommend talking to Dr Riley Kill.

## 2021-03-17 NOTE — Telephone Encounter (Signed)
I would need to see the area in person (as well as her gait). We could put her on the cancel list for a visit asap.  thx

## 2021-03-20 NOTE — Telephone Encounter (Signed)
Called mother back. They have decided to move forward with LP. Advised we will place orders and she will be called in the next week or so to get her daughter scheduled for this. She verbalized understanding and appreciation.

## 2021-03-20 NOTE — Addendum Note (Signed)
Addended by: Arther Abbott on: 03/20/2021 02:55 PM   Modules accepted: Orders

## 2021-03-20 NOTE — Telephone Encounter (Addendum)
I have spoken with Denise [name] and let her know we will place her on the cancellation list. Denise [name] has an appt scheduled for 4/13 with Dr Riley Kill. I encouraged her to see if she could get in with PCP if she has increased concerns. She says they cannot get in with PCP but she has spoken with Dr Epimenio Foot about her gait and he encouraged her to schedule [facility] for a lumbar puncture. She plans to schedule the procedure.

## 2021-03-20 NOTE — Telephone Encounter (Signed)
Pt's mother is asking for a call back to go about scheduling the LP for pt

## 2021-03-22 ENCOUNTER — Other Ambulatory Visit: Payer: Self-pay

## 2021-03-22 ENCOUNTER — Ambulatory Visit: Payer: 59 | Admitting: Physical Therapy

## 2021-03-22 ENCOUNTER — Encounter: Payer: 59 | Admitting: Psychology

## 2021-03-22 ENCOUNTER — Encounter (HOSPITAL_BASED_OUTPATIENT_CLINIC_OR_DEPARTMENT_OTHER): Payer: 59 | Admitting: Physical Medicine & Rehabilitation

## 2021-03-22 ENCOUNTER — Encounter: Payer: Self-pay | Admitting: Physical Medicine & Rehabilitation

## 2021-03-22 ENCOUNTER — Encounter: Payer: Self-pay | Admitting: Physical Therapy

## 2021-03-22 ENCOUNTER — Ambulatory Visit: Payer: 59

## 2021-03-22 VITALS — BP 107/75 | HR 86 | Temp 98.3°F | Ht 63.0 in | Wt 120.2 lb

## 2021-03-22 DIAGNOSIS — F329 Major depressive disorder, single episode, unspecified: Secondary | ICD-10-CM | POA: Diagnosis not present

## 2021-03-22 DIAGNOSIS — R2681 Unsteadiness on feet: Secondary | ICD-10-CM

## 2021-03-22 DIAGNOSIS — E232 Diabetes insipidus: Secondary | ICD-10-CM | POA: Insufficient documentation

## 2021-03-22 DIAGNOSIS — R41840 Attention and concentration deficit: Secondary | ICD-10-CM

## 2021-03-22 DIAGNOSIS — S069X3S Unspecified intracranial injury with loss of consciousness of 1 hour to 5 hours 59 minutes, sequela: Secondary | ICD-10-CM | POA: Diagnosis not present

## 2021-03-22 DIAGNOSIS — M6281 Muscle weakness (generalized): Secondary | ICD-10-CM

## 2021-03-22 DIAGNOSIS — R41841 Cognitive communication deficit: Secondary | ICD-10-CM

## 2021-03-22 DIAGNOSIS — S069X0D Unspecified intracranial injury without loss of consciousness, subsequent encounter: Secondary | ICD-10-CM | POA: Diagnosis not present

## 2021-03-22 DIAGNOSIS — R9082 White matter disease, unspecified: Secondary | ICD-10-CM | POA: Diagnosis not present

## 2021-03-22 MED ORDER — CITALOPRAM HYDROBROMIDE 10 MG PO TABS: 10.0000 mg | ORAL_TABLET | Freq: Every day | ORAL | 2 refills | Status: DC
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
## 2021-03-22 NOTE — Patient Instructions (Addendum)
Access Code: [email or web address] URL: [email or web address] Date: 03/22/2021 Prepared by: Lonia Blood  Exercises Seated Hamstring Stretch - 1 x daily - 5 x weekly - 1 sets - 3 reps - 30 sec hold Romberg Stance Eyes Closed on Foam Pad - 1 x daily - 5 x weekly - 2 sets - 5 reps Single Leg Stance with Support - 1 x daily - 5 x weekly - 1 sets - 3 reps - 10 sec hold Tandem Stance - 1 x daily - 5 x weekly - 1 sets - 5 reps Single Leg Heel Raise with Counter Support - 1 x daily - 5 x weekly - 2 sets - 10 reps Toe Walking with Counter Support - 1 x daily - 5 x weekly - 1 sets - 3 reps  Added 03/22/21 Standing posture stretch - 1 x daily - 7 x weekly - 1-2 sets - 5 reps - 10 sec hold
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
## 2021-03-22 NOTE — Patient Instructions (Addendum)
PLEASE FEEL FREE TO CALL OUR OFFICE WITH ANY PROBLEMS OR QUESTIONS [phone])  RETURN TO DRIVING PLAN:  WITH THE SUPERVISION OF A LICENSED DRIVER, PLEASE DRIVE IN AN EMPTY PARKING LOT FOR AT LEAST 2-3 TRIALS TO TEST REACTION TIME, VISION, USE OF EQUIPMENT IN CAR, ETC.  IF SUCCESSFUL WITH THE PARKING LOT DRIVING, PROCEED TO SUPERVISED DRIVING TRIALS IN YOUR NEIGHBORHOOD STREETS AT LOW TRAFFIC TIMES TO TEST OBSERVATION TO TRAFFIC SIGNALS, REACTION TIME, ETC. PLEASE ATTEMPT AT LEAST 2-3 TRIALS IN YOUR NEIGHBORHOOD.  IF NEIGHBORHOOD DRIVING IS SUCCESSFUL, YOU MAY PROCEED TO DRIVING IN BUSIER AREAS IN YOUR COMMUNITY WITH SUPERVISION OF A LICENSED DRIVER. PLEASE ATTEMPT AT LEAST 4-5 TRIALS.  IF COMMUNITY DRIVING IS SUCCESSFUL, YOU MAY PROCEED TO DRIVING ALONE, DURING THE DAY TIME, IN NON-PEAK TRAFFIC TIMES. YOU SHOULD DRIVE NO FURTHER THAN 30 MINUTES IN ONE DIRECTION. PLEASE DO NOT DRIVE IF YOU FEEL FATIGUED OR UNDER THE INFLUENCE OF MEDICATION.

## 2021-03-23 LAB — BASIC METABOLIC PANEL
BUN/Creatinine Ratio: 19 (ref 9–23)
BUN: 11 mg/dL (ref 6–20)
CO2: 24 mmol/L (ref 20–29)
Calcium: 10.1 mg/dL (ref 8.7–10.2)
Chloride: 100 mmol/L (ref 96–106)
Creatinine, Ser: 0.59 mg/dL (ref 0.57–1.00)
Glucose: 91 mg/dL (ref 65–99)
Potassium: 4.4 mmol/L (ref 3.5–5.2)
Sodium: 141 mmol/L (ref 134–144)
eGFR: 129 mL/min/{1.73_m2} (ref >59)

## 2021-03-24 LAB — OSMOLALITY: Osmolality Meas: 291 mOsmol/kg (ref 275–295)

## 2021-03-24 LAB — OSMOLALITY, URINE: Osmolality, Ur: 656 mOsmol/kg

## 2021-03-27 ENCOUNTER — Ambulatory Visit
Admission: RE | Admit: 2021-03-27 | Discharge: 2021-03-27 | Disposition: A | Discharge status: Home / Self Care | Payer: 59 | Source: Ambulatory Visit | Attending: Neurology | Admitting: Neurology

## 2021-03-27 ENCOUNTER — Other Ambulatory Visit: Payer: Self-pay

## 2021-03-27 DIAGNOSIS — R9089 Other abnormal findings on diagnostic imaging of central nervous system: Secondary | ICD-10-CM

## 2021-03-27 DIAGNOSIS — R4189 Other symptoms and signs involving cognitive functions and awareness: Secondary | ICD-10-CM

## 2021-03-27 DIAGNOSIS — R9082 White matter disease, unspecified: Secondary | ICD-10-CM

## 2021-03-27 NOTE — Discharge Instructions (Signed)

## 2021-03-27 NOTE — Progress Notes (Addendum)
53ml of blood sample for labs drawn from rt a/c vein.

## 2021-03-28 ENCOUNTER — Telehealth: Payer: Self-pay | Admitting: *Deleted

## 2021-03-28 NOTE — Telephone Encounter (Signed)
Called [facility] at [phone]. Account# [id number]. Spoke w/ Karel Jarvis. Oligoclonal bands and IgG is still pending from LP that was completed 03/27/21.

## 2021-03-29 DIAGNOSIS — Z0271 Encounter for disability determination: Secondary | ICD-10-CM

## 2021-03-30 ENCOUNTER — Telehealth: Payer: Self-pay | Admitting: *Deleted

## 2021-03-30 NOTE — Telephone Encounter (Signed)
Denise [name] called to request refills on Denise [name]'s Lorazepam and methocarbamol.

## 2021-03-31 MED ORDER — METHOCARBAMOL 500 MG PO TABS: 1000.0000 mg | ORAL_TABLET | Freq: Four times a day (QID) | ORAL | 1 refills | Status: DC

## 2021-03-31 MED ORDER — LORAZEPAM 1 MG PO TABS: 1.0000 mg | ORAL_TABLET | Freq: Three times a day (TID) | ORAL | 1 refills | Status: DC

## 2021-03-31 NOTE — Telephone Encounter (Signed)
I notified Denise [name]. She says she has been giving only one of the robaxin q6 hours.

## 2021-03-31 NOTE — Telephone Encounter (Signed)
I refilled meds. Please tell Mom that I would like to start reducing robaxin to 500mg  QID if she can tolerate that decrease.   thx

## 2021-04-01 LAB — OLIGOCLONAL BANDS, CSF + SERM

## 2021-04-01 LAB — CSF CELL COUNT WITH DIFFERENTIAL
RBC Count, CSF: 1 cells/uL — ABNORMAL HIGH
WBC, CSF: 3 cells/uL (ref 0–5)

## 2021-04-01 LAB — PROTEIN, CSF: Total Protein, CSF: 32 mg/dL (ref 15–45)

## 2021-04-01 LAB — GLUCOSE, CSF: Glucose, CSF: 60 mg/dL (ref 40–80)

## 2021-04-01 LAB — CNS IGG SYNTHESIS RATE, CSF+BLOOD
Albumin Serum: 4.2 g/dL (ref 3.5–5.2)
Albumin, CSF: 16.2 mg/dL (ref 8.0–42.0)
CNS-IgG Synthesis Rate: 1.6 mg/24 h (ref <=3.3)
IgG (Immunoglobin G), Serum: 1190 mg/dL (ref 600–1640)
IgG Total CSF: 3.3 mg/dL (ref 0.8–7.7)
IgG-Index: 0.72 — ABNORMAL HIGH (ref <0.66)

## 2021-04-01 LAB — VDRL, CSF: VDRL Quant, CSF: NONREACTIVE

## 2021-04-03 ENCOUNTER — Telehealth: Payer: Self-pay | Admitting: *Deleted

## 2021-04-03 ENCOUNTER — Telehealth: Payer: Self-pay | Admitting: Neurology

## 2021-04-03 NOTE — Telephone Encounter (Signed)
The cerebrospinal fluid showed at least 5 IgG bands in the CSF that were not present in the serum.  Additionally the IgG index was elevated.  The combination of the MRI appearance with supratentorial and infratentorial and spinal cord lesions and the CSF makes [place] more certain of the diagnosis of MS.  I left a message that she should call us back to set up an appointment to go over treatments.  I see in the chart that she is scheduled to see Dr. Ronalee Red at [facility]  I look forward to his evaluation as well.

## 2021-04-03 NOTE — Telephone Encounter (Signed)
Called and spoke with mother. Relayed results per Dr. Bonnita Hollow note. Scheduled appt for 04/19/21 at 1:30pm w/ Dr. Epimenio Foot. Asked they check in by 1:00pm. She has ST after at 2:45pm right next door.

## 2021-04-03 NOTE — Telephone Encounter (Signed)
-----   Message from Asa Lente, MD sent at 04/03/2021  9:58 AM EDT ----- The spinal fluid did show changes consistent with MS.  Therefore, I would like to have her come in to discuss treatment options.  I note that she has an appointment to see Dr. Ronalee Red at [facility] later this week so I am not sure if she would prefer to be followed here or at [facility]

## 2021-04-04 ENCOUNTER — Ambulatory Visit: Payer: 59 | Attending: Physician Assistant

## 2021-04-04 ENCOUNTER — Encounter: Payer: 59 | Attending: Registered Nurse | Admitting: Psychology

## 2021-04-04 ENCOUNTER — Other Ambulatory Visit: Payer: Self-pay

## 2021-04-04 DIAGNOSIS — R9082 White matter disease, unspecified: Secondary | ICD-10-CM | POA: Insufficient documentation

## 2021-04-04 DIAGNOSIS — F329 Major depressive disorder, single episode, unspecified: Secondary | ICD-10-CM

## 2021-04-04 DIAGNOSIS — R454 Irritability and anger: Secondary | ICD-10-CM | POA: Diagnosis not present

## 2021-04-04 DIAGNOSIS — S069X3S Unspecified intracranial injury with loss of consciousness of 1 hour to 5 hours 59 minutes, sequela: Secondary | ICD-10-CM | POA: Insufficient documentation
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
## 2021-04-05 ENCOUNTER — Ambulatory Visit: Payer: 59

## 2021-04-05 ENCOUNTER — Ambulatory Visit: Payer: 59 | Admitting: Physical Medicine & Rehabilitation

## 2021-04-05 ENCOUNTER — Other Ambulatory Visit: Payer: Self-pay | Admitting: Physical Medicine & Rehabilitation

## 2021-04-05 DIAGNOSIS — F329 Major depressive disorder, single episode, unspecified: Secondary | ICD-10-CM

## 2021-04-05 NOTE — Telephone Encounter (Signed)
Requesting 90 day supply for Celexa. Is this okay?

## 2021-04-05 NOTE — Telephone Encounter (Signed)
Yes that is fine.  Sent refill to pharmacy for 90 days.  Thanks

## 2021-04-13 ENCOUNTER — Encounter: Payer: Self-pay | Admitting: Psychology

## 2021-04-18 ENCOUNTER — Other Ambulatory Visit: Payer: Self-pay | Admitting: Physical Medicine & Rehabilitation

## 2021-04-18 ENCOUNTER — Telehealth: Payer: Self-pay
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
## 2021-04-19 ENCOUNTER — Ambulatory Visit: Payer: Self-pay | Admitting: Neurology

## 2021-04-19 ENCOUNTER — Ambulatory Visit: Payer: 59
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
## 2021-04-28 ENCOUNTER — Telehealth: Payer: Self-pay | Admitting: *Deleted

## 2021-04-28 NOTE — Telephone Encounter (Signed)
We were going to discuss that at her visit in 2 weeks. With all the behavioral problems going on, i'm not releasing her to work until I lay eyes on her. We had set that time frame at her last visit essentially as well.

## 2021-04-28 NOTE — Telephone Encounter (Signed)
Mrs Leatherbury called it on the behalf of Denise [name].  She said Denise [name] was with her (and I could hear her talking in the background).  She says Denise [name] needs to know when she can go back to work.  Mrs Vanorder then began saying she (herself) has bills to pay and other responsibilities, and she was begging for Denise [name] to be released to go back to work.

## 2021-05-01 ENCOUNTER — Encounter: Payer: 59 | Admitting: Psychology

## 2021-05-02 NOTE — Telephone Encounter (Signed)
Notified. 

## 2021-05-03 ENCOUNTER — Ambulatory Visit: Payer: 59 | Attending: Physician Assistant

## 2021-05-03 ENCOUNTER — Other Ambulatory Visit: Payer: Self-pay

## 2021-05-03 DIAGNOSIS — R41841 Cognitive communication deficit: Secondary | ICD-10-CM | POA: Insufficient documentation
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
## 2021-05-11 ENCOUNTER — Other Ambulatory Visit: Payer: Self-pay | Admitting: Physical Medicine & Rehabilitation

## 2021-05-17 ENCOUNTER — Other Ambulatory Visit: Payer: Self-pay

## 2021-05-17 ENCOUNTER — Telehealth: Payer: Self-pay

## 2021-05-17 ENCOUNTER — Encounter: Payer: 59 | Attending: Registered Nurse | Admitting: Physical Medicine & Rehabilitation

## 2021-05-17 ENCOUNTER — Encounter: Payer: Self-pay | Admitting: Physical Medicine & Rehabilitation

## 2021-05-17 VITALS — BP 114/81 | HR 100 | Temp 98.1°F | Ht 63.0 in | Wt 123.8 lb

## 2021-05-17 DIAGNOSIS — F329 Major depressive disorder, single episode, unspecified: Secondary | ICD-10-CM

## 2021-05-17 DIAGNOSIS — S069X0S Unspecified intracranial injury without loss of consciousness, sequela: Secondary | ICD-10-CM | POA: Diagnosis not present

## 2021-05-17 DIAGNOSIS — R4689 Other symptoms and signs involving appearance and behavior: Secondary | ICD-10-CM

## 2021-05-17 DIAGNOSIS — E232 Diabetes insipidus: Secondary | ICD-10-CM

## 2021-05-17 DIAGNOSIS — S069XAS Unspecified intracranial injury with loss of consciousness status unknown, sequela: Secondary | ICD-10-CM | POA: Insufficient documentation

## 2021-05-17 MED ORDER — DESMOPRESSIN ACETATE 0.1 MG PO TABS: ORAL_TABLET | ORAL | 1 refills | Status: DC

## 2021-05-17 MED ORDER — TRAZODONE HCL 50 MG PO TABS: 100.0000 mg | ORAL_TABLET | Freq: Every day | ORAL | 4 refills | Status: DC

## 2021-05-17 NOTE — Progress Notes (Signed)
Subjective:    Patient ID: Denise [name], female    DOB: Oct 01, 1996, 25 y.o.   MRN: [id number]  HPI   Denise [name] is here in follow-up of her traumatic brain injury and associated deficits.  Since we last met her diagnosis of multiple sclerosis was confirmed by her CSF results.  She has seen neuropsychology in efforts to address anger and aggressive behavior as well as goal setting.  He also has been addressing suicidal ideations and increasing hopefulness.  She has been feeling more up beat. Her mood has been improving. She is anxious to get back to work.   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 She has spoken with her manager about returning as well. She works at [employer] in [employer].      Pain Inventory Average Pain [facility] -Dr. Nigel Bridgeman  Pain Right Now 0 My pain is intermittent, tingling and aching  LOCATION OF PAIN  Right hip & right leg  BOWEL Number of stools per week: 3-4 Oral laxative use No  Type of laxative none Enema or suppository use No  History of colostomy No  Incontinent No   BLADDER Normal In and out cath, frequency N/a Able to self cath No  Bladder incontinence No  Frequent urination Yes  Leakage with coughing No  Difficulty starting stream Yes  Incomplete bladder emptying No    Mobility how many minutes can you walk? No problem walking but gets unsteady. ability to climb steps?  yes do you drive?  no  Function disabled: date disabled in progress  Neuro/Psych numbness tingling trouble walking spasms depression anxiety  Prior Studies Any changes since last visit?  yes CT/MRI lumbar puncture.  Physicians involved in your care Any changes since last visit?  yes   Family History  Problem Relation Age of Onset  . Hypertension Father   . Hypertension Mother   . Hypertension Maternal Grandmother    Social History   Socioeconomic History  . Marital status: Single    Spouse name: Not on file  . Number of children: 0   . Years of education: 47  . Highest education level: Not on file  Occupational History  . Not on file  Tobacco Use  . Smoking status: Never Smoker  . Smokeless tobacco: Never Used  Vaping Use  . Vaping Use: Some days  . Substances: Flavoring  Substance and Sexual Activity  . Alcohol use: Not Currently  . Drug use: Yes    Types: Marijuana  . Sexual activity: Yes    Partners: Male    Birth control/protection: OCP  Other Topics Concern  . Not on file  Social History Narrative   ** Merged History Encounter **    Right handed   Soda daily, coffee sometimes    Lives with mother and grandmother    Social Determinants of Health   Financial Resource Strain: Not on file  Food Insecurity: Not on file  Transportation Needs: Not on file  Physical Activity: Not on file  Stress: Not on file  Social Connections: Not on file   Past Surgical History:  Procedure Laterality Date  . MYRINGOTOMY    . WISDOM TOOTH EXTRACTION  2014   Past Medical History:  Diagnosis Date  . ADHD (attention deficit hyperactivity disorder)   . Bipolar disorder with depression (HCC)    BP 114/81   Pulse 100   Temp 98.1 F (36.7 C)   Ht 5' 3" (1.6 m)  Wt 123 lb 12.8 oz (56.2 kg)   BMI 21.93 kg/m   Opioid Risk Score:   Fall Risk Score:  `1  Depression screen PHQ 2/9  Depression screen [facility] 2/9 03/22/2021 02/01/2021  Decreased Interest 1 0  Down, Depressed, Hopeless 1 3  PHQ - 2 Score 2 3  Altered sleeping - 3  Tired, decreased energy - 1  Change in appetite - 3  Feeling bad or failure about yourself  - 3  Trouble concentrating - 0  Moving slowly or fidgety/restless - 0  Suicidal thoughts - 0  PHQ-9 Score - 13   Review of Systems  Musculoskeletal: Positive for gait problem.  All other systems reviewed and are negative.      Objective:   Physical Exam         General: No acute distress HEENT: EOMI, oral membranes moist Cards: reg rate  Chest: normal effort Abdomen: Soft, NT,  ND Skin: dry, intact Extremities: no edema Psych: pleasant and appropriate, occasionally tearful. Skin: intact Neuro: Is alert and oriented x3.  Normal insight and awareness.  Functional memory.  Gait is appropriate.  Strengths nearly 4+ to 5 out of 5 bilaterally and that she occasionally will demonstrate some weakness in the right leg and inconsistent manner.  Sensation seems to be intact in all 4 light touch and pain. Musculoskeletal: normal rom           Assessment & Plan:  1. TBI with polytrauma             -encouraged exercising, going to the gym             -may drive  -discussed return to work. [place] with part time work initially, 4-5 hours per day to start for two weeks. Needs to watch out for fatigue, no limits  -discussed taste/smell changes 2. C3 ligamentous injury- resolved    3. Diabetes Insipidus             -improving sx             -reduce DDAVP 0.63m, resume .137mif thirst returns. Would like to wean completely off. 4. New diagnosed MS             -LP + for MS            - [facility] f/u   5. Sleep, insomnia             -continue trazodone for now. Can wean later this summer  -melatonin ok.  6. Reactive depression, hx of ADHD, ODD in setting of brain injury             -celexa--can stop.   Thirty minutes of face to face patient care time were spent during this visit. All questions were encouraged and answered. Follow up with me in 4 mos.

## 2021-05-17 NOTE — Patient Instructions (Addendum)
I'M OK WITH YOU RETURNING TO WORK ON A PART TIME BASIS, AT LEAST FOR 2 WEEKS AND BUILD UP FROM THERE.  I RECOMMEND 4-5 HOUR SHIFT TO START.   DON'T TRY TO WORK TOO FAR PAST YOUR FATIGUE.   MAYBE THIS SUMMER, July, CONSIDER REDUCING THE TRAZODONE TO 50MG 

## 2021-05-17 NOTE — Telephone Encounter (Signed)
Denise [name] Mom requested refills on medication Desmopressin and trazodone . She said she forgot to mention it when they saw you in the room . Thank you

## 2021-05-17 NOTE — Addendum Note (Signed)
Addended by: Faith Rogue T on: 05/17/2021 04:48 PM   Modules accepted: Orders

## 2021-05-18 ENCOUNTER — Ambulatory Visit: Payer: 59 | Admitting: Psychology

## 2021-05-19 ENCOUNTER — Other Ambulatory Visit (HOSPITAL_COMMUNITY)
Admission: RE | Admit: 2021-05-19 | Discharge: 2021-05-19 | Disposition: A | Discharge status: Home / Self Care | Payer: 59 | Source: Ambulatory Visit | Attending: Obstetrics and Gynecology | Admitting: Obstetrics and Gynecology

## 2021-05-19 ENCOUNTER — Other Ambulatory Visit: Payer: Self-pay

## 2021-05-19 ENCOUNTER — Ambulatory Visit (INDEPENDENT_AMBULATORY_CARE_PROVIDER_SITE_OTHER): Payer: 59

## 2021-05-19 DIAGNOSIS — R8290 Unspecified abnormal findings in urine: Secondary | ICD-10-CM

## 2021-05-19 DIAGNOSIS — N898 Other specified noninflammatory disorders of vagina: Secondary | ICD-10-CM | POA: Diagnosis present

## 2021-05-19 NOTE — Progress Notes (Signed)
Pt is in the office for self swab and urine culture. Pt reports cloudy urine and odor with urination, also reports vaginal discharge.

## 2021-05-23 LAB — CERVICOVAGINAL ANCILLARY ONLY
Bacterial Vaginitis (gardnerella): POSITIVE — AB
Candida Glabrata: NEGATIVE
Candida Vaginitis: NEGATIVE
Chlamydia: NEGATIVE
Comment: NEGATIVE
Comment: NEGATIVE
Comment: NEGATIVE
Comment: NEGATIVE
Comment: NEGATIVE
Comment: NORMAL
Neisseria Gonorrhea: NEGATIVE
Trichomonas: NEGATIVE

## 2021-05-24 ENCOUNTER — Other Ambulatory Visit: Payer: Self-pay | Admitting: *Deleted

## 2021-05-24 LAB — URINE CULTURE

## 2021-05-24 MED ORDER — CEPHALEXIN 500 MG PO CAPS: 500.0000 mg | ORAL_CAPSULE | Freq: Three times a day (TID) | ORAL | 0 refills | Status: DC

## 2021-05-24 NOTE — Progress Notes (Signed)
Keflex sent for UTI See lab result

## 2021-05-31 ENCOUNTER — Ambulatory Visit: Payer: 59 | Attending: Physician Assistant

## 2021-05-31 ENCOUNTER — Other Ambulatory Visit: Payer: Self-pay

## 2021-05-31 ENCOUNTER — Ambulatory Visit: Payer: 59 | Admitting: Psychology

## 2021-05-31 DIAGNOSIS — R41841 Cognitive communication deficit: Secondary | ICD-10-CM | POA: Diagnosis present
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
## 2021-06-05 ENCOUNTER — Ambulatory Visit: Payer: 59 | Admitting: Family Medicine

## 2021-06-06 ENCOUNTER — Ambulatory Visit: Payer: 59 | Admitting: Psychology

## 2021-06-10 ENCOUNTER — Other Ambulatory Visit: Payer: Self-pay | Admitting: Physical Medicine & Rehabilitation

## 2021-06-12 MED ORDER — LORAZEPAM 1 MG PO TABS: 1.0000 mg | ORAL_TABLET | Freq: Three times a day (TID) | ORAL | 1 refills | Status: DC

## 2021-06-19 ENCOUNTER — Ambulatory Visit: Payer: 59 | Admitting: Obstetrics

## 2021-06-19 ENCOUNTER — Other Ambulatory Visit: Payer: Self-pay | Admitting: Physical Medicine & Rehabilitation

## 2021-06-21 ENCOUNTER — Ambulatory Visit: Payer: 59 | Admitting: Psychology

## 2021-06-21 ENCOUNTER — Ambulatory Visit: Payer: 59 | Admitting: Physical Medicine & Rehabilitation

## 2021-06-30 ENCOUNTER — Ambulatory Visit: Payer: 59 | Admitting: Psychology

## 2021-07-01 ENCOUNTER — Other Ambulatory Visit: Payer: Self-pay

## 2021-07-01 ENCOUNTER — Emergency Department (HOSPITAL_COMMUNITY): Payer: 59

## 2021-07-01 ENCOUNTER — Encounter (HOSPITAL_COMMUNITY): Payer: Self-pay | Admitting: Emergency Medicine

## 2021-07-01 ENCOUNTER — Emergency Department (HOSPITAL_COMMUNITY)
Admission: EM | Admit: 2021-07-01 | Discharge: 2021-07-01 | Disposition: A | Discharge status: Home / Self Care | Payer: 59 | Attending: Emergency Medicine | Admitting: Emergency Medicine

## 2021-07-01 DIAGNOSIS — W010XXA Fall on same level from slipping, tripping and stumbling without subsequent striking against object, initial encounter: Secondary | ICD-10-CM | POA: Diagnosis not present

## 2021-07-01 DIAGNOSIS — E119 Type 2 diabetes mellitus without complications: Secondary | ICD-10-CM | POA: Diagnosis not present

## 2021-07-01 DIAGNOSIS — Y9289 Other specified places as the place of occurrence of the external cause: Secondary | ICD-10-CM | POA: Insufficient documentation

## 2021-07-01 DIAGNOSIS — R079 Chest pain, unspecified: Secondary | ICD-10-CM | POA: Insufficient documentation

## 2021-07-01 DIAGNOSIS — R519 Headache, unspecified: Secondary | ICD-10-CM | POA: Diagnosis present

## 2021-07-01 DIAGNOSIS — W19XXXA Unspecified fall, initial encounter: Secondary | ICD-10-CM

## 2021-07-01 HISTORY — DX: Unspecified intracranial injury with loss of consciousness of unspecified duration, initial encounter: S06.9X9A

## 2021-07-01 HISTORY — DX: Multiple sclerosis: G35

## 2021-07-01 HISTORY — DX: Unspecified intracranial injury with loss of consciousness status unknown, initial encounter: S06.9XAA
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
## 2021-07-01 NOTE — Discharge Instructions (Addendum)
We discussed the results of your CT head, CT maxillofacial today.  You were provided with a copy of your CT head for you on records.  Your chest x-ray was also negative on today's visit.  Please follow-up with your primary care physician as scheduled.

## 2021-07-01 NOTE — ED Triage Notes (Signed)
Pt states she tripped and fell down steps last night.  Reports headache, dizziness, and nausea this morning.  Also reports swelling to R jaw.  Denies pain at present and states symptoms have all resolved.  History of TBI.

## 2021-07-03 ENCOUNTER — Ambulatory Visit: Payer: 59 | Admitting: Family Medicine

## 2021-07-03 ENCOUNTER — Telehealth: Payer: Self-pay | Admitting: Physical Medicine & Rehabilitation

## 2021-07-03 NOTE — Telephone Encounter (Signed)
Patient fell down a couple of steps Saturday, hit head.  Head is bruised and face is still swollen.  Mother took her to ED and they did a CT and everything looked fine.  Patient is still having headaches and would like to know what she can do, does she need to come in a see Dr. Riley Kill?  Please call mother.

## 2021-07-03 NOTE — Telephone Encounter (Signed)
Mother called back: Patient is still having headaches.  Taking Tylenol and Ibuprofen but he pain is constant. (Denies nausea, or vomiting)  Mom wants to know if she will be alright? Should she come see you or see PCP?  Call back phone [phone].

## 2021-07-05 ENCOUNTER — Ambulatory Visit: Payer: 59 | Admitting: Family Medicine

## 2021-07-05 ENCOUNTER — Ambulatory Visit: Payer: 59 | Admitting: Neurology

## 2021-07-07 ENCOUNTER — Other Ambulatory Visit: Payer: Self-pay

## 2021-07-07 ENCOUNTER — Ambulatory Visit (INDEPENDENT_AMBULATORY_CARE_PROVIDER_SITE_OTHER): Payer: 59 | Admitting: Family Medicine

## 2021-07-07 ENCOUNTER — Encounter: Payer: Self-pay | Admitting: Family Medicine

## 2021-07-07 DIAGNOSIS — M79652 Pain in left thigh: Secondary | ICD-10-CM | POA: Diagnosis not present

## 2021-07-07 DIAGNOSIS — R29898 Other symptoms and signs involving the musculoskeletal system: Secondary | ICD-10-CM | POA: Diagnosis not present
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
## 2021-07-12 ENCOUNTER — Encounter: Payer: Self-pay | Admitting: Gastroenterology

## 2021-07-12 ENCOUNTER — Emergency Department (HOSPITAL_BASED_OUTPATIENT_CLINIC_OR_DEPARTMENT_OTHER): Payer: 59

## 2021-07-12 ENCOUNTER — Emergency Department (HOSPITAL_BASED_OUTPATIENT_CLINIC_OR_DEPARTMENT_OTHER)
Admission: EM | Admit: 2021-07-12 | Discharge: 2021-07-12 | Disposition: A | Discharge status: Home / Self Care | Payer: 59 | Attending: Emergency Medicine | Admitting: Emergency Medicine

## 2021-07-12 ENCOUNTER — Encounter (HOSPITAL_BASED_OUTPATIENT_CLINIC_OR_DEPARTMENT_OTHER): Payer: Self-pay

## 2021-07-12 ENCOUNTER — Other Ambulatory Visit: Payer: Self-pay

## 2021-07-12 DIAGNOSIS — Z79899 Other long term (current) drug therapy: Secondary | ICD-10-CM | POA: Diagnosis not present

## 2021-07-12 DIAGNOSIS — W010XXA Fall on same level from slipping, tripping and stumbling without subsequent striking against object, initial encounter: Secondary | ICD-10-CM | POA: Insufficient documentation

## 2021-07-12 DIAGNOSIS — S6992XA Unspecified injury of left wrist, hand and finger(s), initial encounter: Secondary | ICD-10-CM | POA: Diagnosis present

## 2021-07-12 DIAGNOSIS — S60417A Abrasion of left little finger, initial encounter: Secondary | ICD-10-CM | POA: Insufficient documentation

## 2021-07-12 DIAGNOSIS — W19XXXA Unspecified fall, initial encounter: Secondary | ICD-10-CM

## 2021-07-12 DIAGNOSIS — S80211A Abrasion, right knee, initial encounter: Secondary | ICD-10-CM | POA: Insufficient documentation

## 2021-07-12 MED ORDER — CEPHALEXIN 250 MG PO CAPS
500.0000 mg | ORAL_CAPSULE | Freq: Once | ORAL | Status: AC
  Administered 2021-07-12: 500 mg via ORAL
  Filled 2021-07-12: qty 2

## 2021-07-12 MED ORDER — CEPHALEXIN 500 MG PO CAPS: 500.0000 mg | ORAL_CAPSULE | Freq: Three times a day (TID) | ORAL | 0 refills | Status: DC

## 2021-07-12 MED ORDER — BACITRACIN ZINC 500 UNIT/GM EX OINT: TOPICAL_OINTMENT | Freq: Two times a day (BID) | CUTANEOUS | Status: DC

## 2021-07-12 NOTE — ED Notes (Signed)
Pt provided discharge instructions and prescription information. Pt was given the opportunity to ask questions and questions were answered. Discharge signature not obtained in the setting of the COVID-19 pandemic in order to reduce high touch surfaces.  ° °

## 2021-07-12 NOTE — ED Triage Notes (Signed)
Pt reports trip/fall 2 days ago-pain to left pinky finger finger and right knee-NAD-steady gait

## 2021-07-12 NOTE — Discharge Instructions (Addendum)
You were seen in the emergency department today due to injuries to your left fifth finger and your right knee.  Your x-rays did not show any fractures or dislocations.  Your wounds were cleansed and dressed in the emergency department.  We are sending you home with Keflex to take to cover for infection.  Please take this as prescribed.  Please follow attached wound care instructions and utilize over-the-counter antibiotic ointment such as Neosporin.  We have prescribed you new medication(s) today. Discuss the medications prescribed today with your pharmacist as they can have adverse effects and interactions with your other medicines including over the counter and prescribed medications. Seek medical evaluation if you start to experience new or abnormal symptoms after taking one of these medicines, seek care immediately if you start to experience difficulty breathing, feeling of your throat closing, facial swelling, or rash as these could be indications of a more serious allergic reaction   Please follow-up with primary care for recheck of these areas within 3 days.  Return to the ER for new or worsening symptoms including but not limited to new or worsening pain, spreading redness, fever, pus draining from these areas, inability to move at the affected joints, or any other concerns.
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
## 2021-07-17 ENCOUNTER — Ambulatory Visit: Payer: 59 | Admitting: Obstetrics

## 2021-08-02 ENCOUNTER — Telehealth: Payer: Self-pay

## 2021-08-02 NOTE — Telephone Encounter (Signed)
Pt mother called twice today requesting pt have mammogram before scheduled [facility] for MS. Pt states she is concerned and have researched on this.Mother wants assurance the infusion is scheduled for 08/10/21 pt last saw Dr.Constant 03/2020.  Please advise

## 2021-08-11 ENCOUNTER — Ambulatory Visit: Payer: 59 | Admitting: Gastroenterology

## 2021-08-15 ENCOUNTER — Other Ambulatory Visit: Payer: Self-pay | Admitting: Physical Medicine & Rehabilitation

## 2021-08-21 ENCOUNTER — Other Ambulatory Visit: Payer: Self-pay | Admitting: Physical Medicine & Rehabilitation

## 2021-09-01 ENCOUNTER — Other Ambulatory Visit: Payer: Self-pay | Admitting: Physical Medicine & Rehabilitation

## 2021-09-04 ENCOUNTER — Ambulatory Visit: Payer: 59 | Admitting: Gastroenterology

## 2021-09-13 ENCOUNTER — Ambulatory Visit: Payer: 59 | Admitting: Physical Medicine & Rehabilitation

## 2021-09-28 ENCOUNTER — Ambulatory Visit: Payer: 59 | Admitting: Gastroenterology

## 2021-09-29 ENCOUNTER — Ambulatory Visit (INDEPENDENT_AMBULATORY_CARE_PROVIDER_SITE_OTHER): Payer: 59 | Admitting: Gastroenterology

## 2021-09-29 ENCOUNTER — Encounter: Payer: Self-pay | Admitting: Gastroenterology

## 2021-09-29 VITALS — BP 88/60 | HR 68 | Ht 63.0 in | Wt 124.0 lb

## 2021-09-29 DIAGNOSIS — D649 Anemia, unspecified: Secondary | ICD-10-CM

## 2021-09-29 DIAGNOSIS — R12 Heartburn: Secondary | ICD-10-CM

## 2021-09-29 DIAGNOSIS — S36114D Minor laceration of liver, subsequent encounter: Secondary | ICD-10-CM

## 2021-09-29 DIAGNOSIS — R112 Nausea with vomiting, unspecified: Secondary | ICD-10-CM

## 2021-09-29 DIAGNOSIS — R1114 Bilious vomiting: Secondary | ICD-10-CM | POA: Diagnosis not present

## 2021-09-29 DIAGNOSIS — R630 Anorexia: Secondary | ICD-10-CM | POA: Diagnosis not present

## 2021-09-29 DIAGNOSIS — S36119A Unspecified injury of liver, initial encounter: Secondary | ICD-10-CM

## 2021-09-29 MED ORDER — ONDANSETRON HCL 4 MG PO TABS: 4.0000 mg | ORAL_TABLET | Freq: Two times a day (BID) | ORAL | 0 refills | Status: AC

## 2021-09-29 MED ORDER — FAMOTIDINE 20 MG PO TABS: 20.0000 mg | ORAL_TABLET | Freq: Every day | ORAL | 3 refills | Status: DC

## 2021-09-29 NOTE — Patient Instructions (Signed)
You will be contacted by [facility] Scheduling in the next 2 days to arrange a RUQ abdomen ultrasound.  The number on your caller ID will be [phone], please answer when they call.  If you have not heard from them in 2 days please call [phone] to schedule.    Your provider has requested that you go to the basement level for lab work before leaving today. Press "B" on the elevator. The lab is located at the first door on the left as you exit the elevator.  We have sent the following medications to your pharmacy for you to pick up at your convenience: Famotidine, Zofran   Follow up in 3 months- Office to contact you with an appointment.   If you are age 24 or younger, your body mass index should be between 19-25. Your Body mass index is 21.97 kg/m. If this is out of the aformentioned range listed, please consider follow up with your Primary Care Provider.   __________________________________________________________  The [facility] providers would like to encourage you to use [facility] to communicate with providers for non-urgent requests or questions.  Due to long hold times on the telephone, sending your provider a message by [facility] may be a faster and more efficient way to get a response.  Please allow 48 business hours for a response.  Please remember that this is for non-urgent requests.   Thank you for choosing me and [facility].  Dr. Meridee Score
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
## 2021-10-01 ENCOUNTER — Encounter: Payer: Self-pay | Admitting: Gastroenterology

## 2021-10-01 DIAGNOSIS — R1114 Bilious vomiting: Secondary | ICD-10-CM | POA: Insufficient documentation

## 2021-10-01 DIAGNOSIS — R630 Anorexia: Secondary | ICD-10-CM | POA: Insufficient documentation

## 2021-10-01 DIAGNOSIS — R12 Heartburn: Secondary | ICD-10-CM | POA: Insufficient documentation

## 2021-10-01 DIAGNOSIS — S36114A Minor laceration of liver, initial encounter: Secondary | ICD-10-CM | POA: Insufficient documentation

## 2021-10-01 DIAGNOSIS — D649 Anemia, unspecified: Secondary | ICD-10-CM | POA: Insufficient documentation

## 2021-11-08 ENCOUNTER — Encounter: Payer: 59 | Attending: Physical Medicine & Rehabilitation | Admitting: Physical Medicine & Rehabilitation

## 2021-11-09 ENCOUNTER — Ambulatory Visit (HOSPITAL_COMMUNITY): Payer: 59

## 2021-11-15 ENCOUNTER — Ambulatory Visit (HOSPITAL_COMMUNITY): Admission: RE | Admit: 2021-11-15 | Payer: 59 | Source: Ambulatory Visit

## 2021-11-18 ENCOUNTER — Other Ambulatory Visit: Payer: Self-pay | Admitting: Physical Medicine & Rehabilitation

## 2021-12-16 ENCOUNTER — Other Ambulatory Visit: Payer: Self-pay | Admitting: Physical Medicine & Rehabilitation

## 2021-12-26 ENCOUNTER — Other Ambulatory Visit: Payer: Self-pay | Admitting: Gastroenterology

## 2022-02-11 DIAGNOSIS — J020 Streptococcal pharyngitis: Secondary | ICD-10-CM | POA: Diagnosis not present

## 2022-02-26 DIAGNOSIS — Z79899 Other long term (current) drug therapy: Secondary | ICD-10-CM | POA: Diagnosis not present

## 2022-02-26 DIAGNOSIS — G35 Multiple sclerosis: Secondary | ICD-10-CM | POA: Diagnosis not present

## 2022-03-13 DIAGNOSIS — B354 Tinea corporis: Secondary | ICD-10-CM | POA: Diagnosis not present

## 2022-03-13 DIAGNOSIS — L739 Follicular disorder, unspecified: Secondary | ICD-10-CM | POA: Diagnosis not present

## 2022-03-23 DIAGNOSIS — R21 Rash and other nonspecific skin eruption: Secondary | ICD-10-CM | POA: Diagnosis not present
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
## 2022-05-16 DIAGNOSIS — L2089 Other atopic dermatitis: Secondary | ICD-10-CM | POA: Diagnosis not present

## 2022-05-16 DIAGNOSIS — L700 Acne vulgaris: Secondary | ICD-10-CM | POA: Diagnosis not present

## 2022-05-19 DIAGNOSIS — R509 Fever, unspecified: Secondary | ICD-10-CM | POA: Diagnosis not present

## 2022-05-19 DIAGNOSIS — R590 Localized enlarged lymph nodes: Secondary | ICD-10-CM | POA: Diagnosis not present

## 2022-05-20 ENCOUNTER — Encounter (HOSPITAL_BASED_OUTPATIENT_CLINIC_OR_DEPARTMENT_OTHER): Payer: Self-pay | Admitting: Emergency Medicine

## 2022-05-20 ENCOUNTER — Other Ambulatory Visit: Payer: Self-pay

## 2022-05-20 ENCOUNTER — Emergency Department (HOSPITAL_BASED_OUTPATIENT_CLINIC_OR_DEPARTMENT_OTHER)
Admission: EM | Admit: 2022-05-20 | Discharge: 2022-05-20 | Disposition: A | Discharge status: Home / Self Care | Payer: 59 | Attending: Emergency Medicine | Admitting: Emergency Medicine

## 2022-05-20 DIAGNOSIS — L040 Acute lymphadenitis of face, head and neck: Secondary | ICD-10-CM | POA: Diagnosis not present

## 2022-05-20 DIAGNOSIS — J029 Acute pharyngitis, unspecified: Secondary | ICD-10-CM | POA: Insufficient documentation

## 2022-05-20 DIAGNOSIS — R220 Localized swelling, mass and lump, head: Secondary | ICD-10-CM | POA: Diagnosis not present

## 2022-05-20 DIAGNOSIS — I889 Nonspecific lymphadenitis, unspecified: Secondary | ICD-10-CM

## 2022-05-20 DIAGNOSIS — Z20822 Contact with and (suspected) exposure to covid-19: Secondary | ICD-10-CM | POA: Diagnosis not present

## 2022-05-20 LAB — MONONUCLEOSIS SCREEN: Mono Screen: NEGATIVE

## 2022-05-20 LAB — RESP PANEL BY RT-PCR (FLU A&B, COVID) ARPGX2
Influenza A by PCR: NEGATIVE
Influenza B by PCR: NEGATIVE
SARS Coronavirus 2 by RT PCR: NEGATIVE

## 2022-05-20 LAB — GROUP A STREP BY PCR: Group A Strep by PCR: NOT DETECTED

## 2022-05-20 MED ORDER — METHYLPREDNISOLONE 4 MG PO TBPK: ORAL_TABLET | ORAL | 0 refills | Status: AC

## 2022-05-20 MED ORDER — AMOXICILLIN-POT CLAVULANATE 875-125 MG PO TABS: 1.0000 | ORAL_TABLET | Freq: Two times a day (BID) | ORAL | 0 refills | Status: AC

## 2022-05-20 MED ORDER — METHYLPREDNISOLONE 4 MG PO TBPK: ORAL_TABLET | ORAL | 0 refills | Status: DC

## 2022-05-20 NOTE — ED Notes (Signed)
Dc instructions and scripts reviewed with pt no questions or concerns at this time. Will follow up with pcp as needed 

## 2022-05-20 NOTE — ED Provider Notes (Signed)
[facility] EMERGENCY DEPT Provider Note   CSN: [id number] Arrival date & time: 05/20/22  1733     History  Chief Complaint  Patient presents with   Oral Swelling    Denise [name] is a 26 y.o. female.  HPI  26 year old female presenting to the emergency department with fever, bilateral cervical lymphadenopathy, sore throat.  The patient states that she has had increasing swelling and has been increasingly hard to swallow.  She was seen in urgent care and prescribed Tylenol and ibuprofen and diagnosed with a viral syndrome.  She reportedly had negative strep PCR testing.  She denies any vomiting, abdominal pain.  She endorses a Tmax of 102 yesterday with some associated chills.  Home Medications Prior to Admission medications   Medication Sig Start Date End Date Taking? Authorizing Provider  amoxicillin-clavulanate (AUGMENTIN) 875-125 MG tablet Take 1 tablet by mouth every 12 (twelve) hours. 05/20/22  Yes Ernie Avena, MD  clindamycin (CLEOCIN T) 1 % lotion SMARTSIG:Sparingly Topical Every Morning 06/12/21   [provider]  desmopressin (DDAVP) 0.1 MG tablet TAKE 1 TABLET BY MOUTH TWICE A DAY 12/20/21   Ranelle Oyster, MD  famotidine (PEPCID) 20 MG tablet TAKE 1 TABLET BY MOUTH EVERY DAY 12/26/21   Mansouraty, Netty Starring., MD  LORazepam (ATIVAN) 1 MG tablet TAKE 1 TABLET BY MOUTH THREE TIMES A DAY 09/01/21   Ranelle Oyster, MD  melatonin 10 MG TABS Take 10 mg by mouth at bedtime. Patient taking differently: Take 10 mg by mouth as needed. 01/16/21   Angiulli, Mcarthur Rossetti, PA-C  methocarbamol (ROBAXIN) 500 MG tablet TAKE 2 TABLETS (1,000 MG TOTAL) BY MOUTH 4 TIMES DAILY. 08/22/21   Ranelle Oyster, MD  methylPREDNISolone (MEDROL DOSEPAK) 4 MG TBPK tablet Take as prescribed on the package 05/20/22   Ernie Avena, MD  Multiple Vitamin (MULTIVITAMIN WITH MINERALS) TABS tablet Take 1 tablet by mouth daily.    [provider]  ondansetron (ZOFRAN) 4 MG  tablet Take 1 tablet (4 mg total) by mouth every 12 (twelve) hours. 09/29/21   Mansouraty, Netty Starring., MD  propranolol (INDERAL) 20 MG tablet TAKE 1 TABLET BY MOUTH THREE TIMES A DAY 06/20/21   Ranelle Oyster, MD  traZODone (DESYREL) 100 MG tablet Take 100 mg by mouth at bedtime. 08/21/21   [provider]  tretinoin (RETIN-A) 0.025 % cream APPLY TO AFFECTED AREA EVERY DAY AT BEDTIME 02/16/21   [provider]      Allergies    Patient has no known allergies.    Review of Systems   Review of Systems  All other systems reviewed and are negative.  Physical Exam Updated Vital Signs BP 120/70 (BP Location: Right Arm)   Pulse 90   Temp 99 F (37.2 C) (Oral)   Resp 16   SpO2 99%  Physical Exam Vitals and nursing note reviewed.  Constitutional:      General: She is not in acute distress.    Appearance: She is well-developed.  HENT:     Head: Normocephalic and atraumatic.     Comments: no clear oral abscess or odontogenic abscess, minimal posterior oropharyngeal erythema with no significant tonsillar exudate or swelling Eyes:     Conjunctiva/sclera: Conjunctivae normal.  Neck:     Comments: Bilateral tender cervical adenopathy and submandibular adenopathy Cardiovascular:     Rate and Rhythm: Normal rate and regular rhythm.  Pulmonary:     Effort: Pulmonary effort is normal. No respiratory distress.  Breath sounds: Normal breath sounds.  Abdominal:     Palpations: Abdomen is soft.     Tenderness: There is no abdominal tenderness.  Musculoskeletal:        General: No swelling.     Cervical back: Full passive range of motion without pain, normal range of motion and neck supple. Tenderness present.  Lymphadenopathy:     Cervical: Cervical adenopathy present.  Skin:    General: Skin is warm and dry.     Capillary Refill: Capillary refill takes less than 2 seconds.  Neurological:     Mental Status: She is alert.  Psychiatric:        Mood and Affect: Mood  normal.    ED Results / Procedures / Treatments   Labs (all labs ordered are listed, but only abnormal results are displayed) Labs Reviewed  GROUP A STREP BY PCR  RESP PANEL BY RT-PCR (FLU A&B, COVID) [id number]  MONONUCLEOSIS SCREEN    EKG None  Radiology No results found.  Procedures Procedures    Medications Ordered in ED Medications - No data to display  ED Course/ Medical Decision Making/ A&P Clinical Course as of 05/21/22 2358  Sun May 20, 2022  1846 Group A Strep by PCR: NOT DETECTED [JL]    Clinical Course User Index [JL] Ernie Avena, MD                           Medical Decision Making Amount and/or Complexity of Data Reviewed Labs: ordered. Decision-making details documented in ED Course.  Risk Prescription drug management.   26 year old female presenting to the emergency department with fever, bilateral cervical lymphadenopathy, sore throat.  The patient states that she has had increasing swelling and has been increasingly hard to swallow.  She was seen in urgent care and prescribed Tylenol and ibuprofen and diagnosed with a viral syndrome.  She reportedly had negative strep PCR testing.  She denies any vomiting, abdominal pain.  She endorses a Tmax of 102 yesterday with some associated chills.  On arrival, the patient was afebrile, temperature 99, tachycardic with subsequent resolution P109 improvement to 90, normotensive, saturating well on room air.  Sinus rhythm noted on cardiac telemetry.  Physical exam significant for no clear oral abscess or odontogenic abscess, minimal posterior oropharyngeal erythema with no significant tonsillar exudate or swelling.  The patient has bilateral cervical and submandibular adenopathy.  No evidence for Ludwig's Angina on exam, no evidence for PTA or RPA with good range of motion of the neck and no clear unilateral peritonsillar swelling.  No evidence for Ludwig's Angina with no significant elevation of the tongue.  Patient  is able to tolerate oral intake on exam.  No significant tonsillar exudate.  Group A strep PCR testing was collected and resulted negative in addition to mononucleosis testing and COVID-19 influenza testing which resulted negative.  Given the patient's lymphadenitis, will prescribe a course of Augmentin and administer Decadron for the swelling.  Overall tolerating oral intake and without signs of serious infection requiring admission or IV antibiotics at this time.  We will plan for discharge and close outpatient follow-up.  Return precautions provided.   Final Clinical Impression(s) / ED Diagnoses Final diagnoses:  Cervical lymphadenitis  Pharyngitis, unspecified etiology    Rx / DC Orders ED Discharge Orders          Ordered    methylPREDNISolone (MEDROL DOSEPAK) 4 MG TBPK tablet  Status:  Discontinued  05/20/22 1910    amoxicillin-clavulanate (AUGMENTIN) 875-125 MG tablet  Every 12 hours        05/20/22 1910    methylPREDNISolone (MEDROL DOSEPAK) 4 MG TBPK tablet        05/20/22 1913              Ernie Avena, MD 05/21/22 2358

## 2022-05-20 NOTE — ED Triage Notes (Signed)
Pt awoke yesterday with fever 102 orally and both sides of her neck (lymph nodes) swollen. Urgent care prescribed tylenol and ibuprofen. Presents today with worsening swelling and increasingly hard to swallow.

## 2022-06-29 ENCOUNTER — Other Ambulatory Visit: Payer: Self-pay | Admitting: Physician Assistant

## 2022-06-29 DIAGNOSIS — G35 Multiple sclerosis: Secondary | ICD-10-CM

## 2022-08-23 ENCOUNTER — Ambulatory Visit
Admission: RE | Admit: 2022-08-23 | Discharge: 2022-08-23 | Disposition: A | Discharge status: Home / Self Care | Payer: 59 | Source: Ambulatory Visit | Attending: Physician Assistant | Admitting: Physician Assistant

## 2022-08-23 DIAGNOSIS — G35 Multiple sclerosis: Secondary | ICD-10-CM

## 2022-08-23 DIAGNOSIS — G9589 Other specified diseases of spinal cord: Secondary | ICD-10-CM | POA: Diagnosis not present

## 2022-08-23 MED ORDER — GADOBENATE DIMEGLUMINE 529 MG/ML IV SOLN
15.0000 mL | Freq: Once | INTRAVENOUS | Status: AC | PRN
  Administered 2022-08-23: 11 mL via INTRAVENOUS

## 2022-08-23 MED ORDER — GADOBENATE DIMEGLUMINE 529 MG/ML IV SOLN: 11.0000 mL | Freq: Once | INTRAVENOUS | Status: DC | PRN

## 2022-08-28 ENCOUNTER — Other Ambulatory Visit: Payer: 59

## 2022-09-10 ENCOUNTER — Other Ambulatory Visit: Payer: 59

## 2022-09-10 ENCOUNTER — Inpatient Hospital Stay: Admission: RE | Admit: 2022-09-10 | Payer: 59 | Source: Ambulatory Visit

## 2022-09-17 ENCOUNTER — Other Ambulatory Visit: Payer: Self-pay

## 2022-09-17 ENCOUNTER — Emergency Department (HOSPITAL_COMMUNITY): Payer: 59

## 2022-09-17 ENCOUNTER — Emergency Department (HOSPITAL_COMMUNITY)
Admission: EM | Admit: 2022-09-17 | Discharge: 2022-09-18 | Disposition: A | Discharge status: Home / Self Care | Payer: 59 | Attending: Emergency Medicine | Admitting: Emergency Medicine

## 2022-09-17 DIAGNOSIS — F10920 Alcohol use, unspecified with intoxication, uncomplicated: Secondary | ICD-10-CM

## 2022-09-17 DIAGNOSIS — S0993XA Unspecified injury of face, initial encounter: Secondary | ICD-10-CM | POA: Diagnosis not present

## 2022-09-17 DIAGNOSIS — R000 Tachycardia, unspecified: Secondary | ICD-10-CM | POA: Diagnosis not present

## 2022-09-17 DIAGNOSIS — Y908 Blood alcohol level of 240 mg/100 ml or more: Secondary | ICD-10-CM | POA: Insufficient documentation

## 2022-09-17 DIAGNOSIS — R519 Headache, unspecified: Secondary | ICD-10-CM | POA: Diagnosis not present

## 2022-09-17 DIAGNOSIS — W19XXXA Unspecified fall, initial encounter: Secondary | ICD-10-CM | POA: Diagnosis not present

## 2022-09-17 DIAGNOSIS — R220 Localized swelling, mass and lump, head: Secondary | ICD-10-CM | POA: Diagnosis not present

## 2022-09-17 DIAGNOSIS — F10120 Alcohol abuse with intoxication, uncomplicated: Secondary | ICD-10-CM | POA: Diagnosis present

## 2022-09-17 DIAGNOSIS — R69 Illness, unspecified: Secondary | ICD-10-CM | POA: Diagnosis not present

## 2022-09-17 DIAGNOSIS — R4182 Altered mental status, unspecified: Secondary | ICD-10-CM | POA: Diagnosis not present

## 2022-09-17 LAB — CBC WITH DIFFERENTIAL/PLATELET
Abs Immature Granulocytes: 0.02 10*3/uL (ref 0.00–0.07)
Basophils Absolute: 0.1 10*3/uL (ref 0.0–0.1)
Basophils Relative: 1 %
Eosinophils Absolute: 0.2 10*3/uL (ref 0.0–0.5)
Eosinophils Relative: 2 %
HCT: 41.5 % (ref 36.0–46.0)
Hemoglobin: 13.6 g/dL (ref 12.0–15.0)
Immature Granulocytes: 0 %
Lymphocytes Relative: 34 %
Lymphs Abs: 3.2 10*3/uL (ref 0.7–4.0)
MCH: 31.6 pg (ref 26.0–34.0)
MCHC: 32.8 g/dL (ref 30.0–36.0)
MCV: 96.3 fL (ref 80.0–100.0)
Monocytes Absolute: 0.7 10*3/uL (ref 0.1–1.0)
Monocytes Relative: 7 %
Neutro Abs: 5.2 10*3/uL (ref 1.7–7.7)
Neutrophils Relative %: 56 %
Platelets: 291 10*3/uL (ref 150–400)
RBC: 4.31 MIL/uL (ref 3.87–5.11)
RDW: 13.2 % (ref 11.5–15.5)
WBC: 9.4 10*3/uL (ref 4.0–10.5)
nRBC: 0 % (ref 0.0–0.2)

## 2022-09-17 LAB — COMPREHENSIVE METABOLIC PANEL
ALT: 13 U/L (ref 0–44)
AST: 20 U/L (ref 15–41)
Albumin: 5 g/dL (ref 3.5–5.0)
Alkaline Phosphatase: 42 U/L (ref 38–126)
Anion gap: 12 (ref 5–15)
BUN: 13 mg/dL (ref 6–20)
CO2: 18 mmol/L — ABNORMAL LOW (ref 22–32)
Calcium: 9.1 mg/dL (ref 8.9–10.3)
Chloride: 116 mmol/L — ABNORMAL HIGH (ref 98–111)
Creatinine, Ser: 0.64 mg/dL (ref 0.44–1.00)
GFR, Estimated: >60 mL/min (ref >60)
Glucose, Bld: 93 mg/dL (ref 70–99)
Potassium: 3.5 mmol/L (ref 3.5–5.1)
Sodium: 146 mmol/L — ABNORMAL HIGH (ref 135–145)
Total Bilirubin: 0.5 mg/dL (ref 0.3–1.2)
Total Protein: 8.1 g/dL (ref 6.5–8.1)

## 2022-09-17 LAB — I-STAT BETA HCG BLOOD, ED (MC, WL, AP ONLY): I-stat hCG, quantitative: <5 m[IU]/mL (ref <5)

## 2022-09-17 LAB — ETHANOL: Alcohol, Ethyl (B): 271 mg/dL — ABNORMAL HIGH (ref <10)

## 2022-09-17 MED ORDER — ONDANSETRON HCL 4 MG/2ML IJ SOLN
4.0000 mg | Freq: Once | INTRAMUSCULAR | Status: AC
Start: 2022-09-17 — End: 2022-09-17
  Administered 2022-09-17: 4 mg via INTRAVENOUS
  Filled 2022-09-17: qty 2

## 2022-09-17 MED ORDER — LORAZEPAM 2 MG/ML IJ SOLN
INTRAMUSCULAR | Status: AC
  Administered 2022-09-17: 2 mg
  Filled 2022-09-17: qty 1

## 2022-09-17 MED ORDER — STERILE WATER FOR INJECTION IJ SOLN
INTRAMUSCULAR | Status: AC
  Filled 2022-09-17: qty 10

## 2022-09-17 MED ORDER — ZIPRASIDONE MESYLATE 20 MG IM SOLR
INTRAMUSCULAR | Status: AC
  Administered 2022-09-17: 20 mg
  Filled 2022-09-17: qty 20
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
## 2022-09-18 ENCOUNTER — Emergency Department (HOSPITAL_COMMUNITY): Payer: 59

## 2022-09-18 DIAGNOSIS — R220 Localized swelling, mass and lump, head: Secondary | ICD-10-CM | POA: Diagnosis not present

## 2022-09-18 DIAGNOSIS — R4182 Altered mental status, unspecified: Secondary | ICD-10-CM | POA: Diagnosis not present

## 2022-09-18 DIAGNOSIS — S0993XA Unspecified injury of face, initial encounter: Secondary | ICD-10-CM | POA: Diagnosis not present

## 2022-09-18 LAB — RAPID URINE DRUG SCREEN, HOSP PERFORMED
Amphetamines: NOT DETECTED
Barbiturates: NOT DETECTED
Benzodiazepines: NOT DETECTED
Cocaine: NOT DETECTED
Opiates: NOT DETECTED
Tetrahydrocannabinol: POSITIVE — AB

## 2022-09-18 NOTE — Discharge Instructions (Signed)
Drink responsibly. Can use neosporin or similar on abrasions to the face. Follow-up with your primary care doctor. Return here for new concerns.

## 2022-09-19 DIAGNOSIS — L089 Local infection of the skin and subcutaneous tissue, unspecified: Secondary | ICD-10-CM | POA: Diagnosis not present

## 2023-03-28 DIAGNOSIS — G35 Multiple sclerosis: Secondary | ICD-10-CM | POA: Diagnosis not present

## 2023-03-28 DIAGNOSIS — Z79899 Other long term (current) drug therapy: Secondary | ICD-10-CM | POA: Diagnosis not present

## 2023-03-28 DIAGNOSIS — R4589 Other symptoms and signs involving emotional state: Secondary | ICD-10-CM | POA: Diagnosis not present

## 2023-03-28 DIAGNOSIS — R634 Abnormal weight loss: Secondary | ICD-10-CM | POA: Diagnosis not present

## 2023-04-19 DIAGNOSIS — M545 Low back pain, unspecified: Secondary | ICD-10-CM | POA: Diagnosis not present

## 2023-04-19 DIAGNOSIS — A084 Viral intestinal infection, unspecified: Secondary | ICD-10-CM | POA: Diagnosis not present

## 2024-03-17 DIAGNOSIS — R350 Frequency of micturition: Secondary | ICD-10-CM | POA: Diagnosis not present

## 2024-03-17 DIAGNOSIS — Z8639 Personal history of other endocrine, nutritional and metabolic disease: Secondary | ICD-10-CM | POA: Diagnosis not present
